# Patient Record
Sex: Female | Born: 1995 | Race: Black or African American | Hispanic: No | Marital: Single | Smoking: Never smoker
Health system: Southern US, Community
[De-identification: ages and names within clinical notes are randomized; demographics above are authoritative.]

## PROBLEM LIST (undated history)

## (undated) DIAGNOSIS — R3121 Asymptomatic microscopic hematuria: Secondary | ICD-10-CM

## (undated) DIAGNOSIS — N390 Urinary tract infection, site not specified: Secondary | ICD-10-CM

## (undated) DIAGNOSIS — F419 Anxiety disorder, unspecified: Secondary | ICD-10-CM

## (undated) DIAGNOSIS — E119 Type 2 diabetes mellitus without complications: Secondary | ICD-10-CM

## (undated) DIAGNOSIS — E282 Polycystic ovarian syndrome: Secondary | ICD-10-CM

---
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
## 2016-03-19 ENCOUNTER — Encounter (HOSPITAL_COMMUNITY): Payer: Self-pay | Admitting: Emergency Medicine

## 2016-03-19 DIAGNOSIS — L309 Dermatitis, unspecified: Secondary | ICD-10-CM | POA: Insufficient documentation

## 2016-03-19 DIAGNOSIS — R21 Rash and other nonspecific skin eruption: Secondary | ICD-10-CM | POA: Diagnosis present

## 2016-03-19 NOTE — ED Notes (Signed)
Pt had tattoo redone three weeks ago, states last night the tattoo area is red and itching. Pt in NAD, resp e/u.

## 2016-03-20 ENCOUNTER — Emergency Department (HOSPITAL_COMMUNITY)
Admission: EM | Admit: 2016-03-20 | Discharge: 2016-03-20 | Disposition: A | Discharge status: Home / Self Care | Payer: Medicaid Other | Attending: Emergency Medicine | Admitting: Emergency Medicine

## 2016-03-20 DIAGNOSIS — L309 Dermatitis, unspecified: Secondary | ICD-10-CM

## 2016-03-20 MED ORDER — DIPHENHYDRAMINE HCL 25 MG PO CAPS
25.0000 mg | ORAL_CAPSULE | Freq: Once | ORAL | Status: AC
  Administered 2016-03-20: 25 mg via ORAL
  Filled 2016-03-20: qty 1

## 2016-03-20 NOTE — Discharge Instructions (Signed)
1. Medications: zyrtec for allergies during the day, benadryl for itching at night, over-the-counter topical anti-itch cream, usual home medications 2. Treatment: rest, drink plenty of fluids 3. Follow Up: please followup with your primary doctor for discussion of your diagnoses and further evaluation after today's visit; if you do not have a primary care doctor use the phone number listed in your discharge paperwork to find one; please return to the ER for new or worsening symptoms

## 2016-03-20 NOTE — ED Provider Notes (Signed)
CSN: [id number]     Arrival date & time 03/19/16  2209 History   First MD Initiated Contact with Patient 03/20/16 [phone]     Chief Complaint  Patient presents with  . Rash     HPI   Julia Michael is a 20 y.o. female with no pertinent PMH who presents to the ED with irritation to the tattoo she had redone several weeks ago. She notes she went to a different tattoo parlor than usual to have pigment added to her tattoo 3 weeks ago, and states she has had some irritation and itching since that time. She denies fever, chills, difficulty swallowing, trouble handling her secretions, shortness of breath, numbness, weakness, paresthesia, pain. She states she has several tattoos though has never experienced these symptoms.   History reviewed. No pertinent past medical history. History reviewed. No pertinent past surgical history. No family history on file. Social History  Substance Use Topics  . Smoking status: Never Smoker   . Smokeless tobacco: None  . Alcohol Use: No   OB History    No data available       Review of Systems  Constitutional: Negative for fever and chills.  HENT: Negative for drooling and trouble swallowing.   Respiratory: Negative for shortness of breath.   Musculoskeletal: Negative for myalgias and arthralgias.  Skin: Positive for rash.  Neurological: Negative for weakness and numbness.      Allergies  Review of patient's allergies indicates no known allergies.  Home Medications   Prior to Admission medications   Medication Sig Start Date End Date Taking? Authorizing Provider  diphenhydrAMINE (BENADRYL) 25 MG tablet Take 2 tablets (50 mg total) by mouth every 4 (four) hours as needed for itching. 08/02/15   Benjamin Cartner, PA-C    BP 130/77 mmHg  Pulse 83  Temp(Src) 98.7 F (37.1 C) (Oral)  Resp 21  SpO2 100%  LMP 02/20/2016 Physical Exam  Constitutional: She is oriented to person, place, and time. She appears well-developed and well-nourished. No  distress.  HENT:  Head: Normocephalic and atraumatic.  Right Ear: External ear normal.  Left Ear: External ear normal.  Nose: Nose normal.  Eyes: Conjunctivae and EOM are normal. Right eye exhibits no discharge. Left eye exhibits no discharge. No scleral icterus.  Neck: Normal range of motion. Neck supple.  Cardiovascular: Normal rate, regular rhythm and intact distal pulses.   Pulmonary/Chest: Effort normal and breath sounds normal. No respiratory distress.  Musculoskeletal: Normal range of motion. She exhibits no edema or tenderness.  Full range of motion of left upper extremity.  Neurological: She is alert and oriented to person, place, and time. She has normal strength. No sensory deficit.  Skin: Skin is warm and dry. She is not diaphoretic.  Tattoo to left shoulder appears clean, dry, and intact. No erythema, edema, or discharge. Patient has a small area of flesh colored papules at the base of the tattoo.   Psychiatric: She has a normal mood and affect. Her behavior is normal.  Nursing note and vitals reviewed.   ED Course  Procedures (including critical care time)  Labs Review Labs Reviewed - No data to display  Imaging Review No results found.    EKG Interpretation None      MDM   Final diagnoses:  Dermatitis    20 year old female presents with irritation at the site of the tattoo she had redone 3 weeks ago. Denies fever, chills, shortness of breath, numbness, weakness, paresthesia. Patient is afebrile. Vital signs stable.  Tattoo to left shoulder appears clean, dry, and intact. No erythema, edema, or discharge to suggest infection. Patient has a small area of flesh colored papules at the base of the tattoo. Symptoms appear consistent with local reaction to pigment. Advised patient to take benadryl for itching and to apply topical anti-itch cream for additional symptom relief. Patient to follow up with PCP. Strict return precautions discussed. Patient verbalizes her  understanding and is in agreement with plan.  BP 130/77 mmHg  Pulse 83  Temp(Src) 98.7 F (37.1 C) (Oral)  Resp 21  SpO2 100%  LMP 02/20/2016      Mady GemmaElizabeth C Westfall, PA-C 03/20/16 0100  Geoffery Lyonsouglas Delo, MD 03/20/16 [phone]

## 2016-03-20 NOTE — ED Notes (Signed)
See PA assessment note. 

## 2016-08-03 ENCOUNTER — Emergency Department (HOSPITAL_COMMUNITY)
Admission: EM | Admit: 2016-08-03 | Discharge: 2016-08-04 | Disposition: A | Discharge status: Home / Self Care | Payer: Medicaid Other | Attending: Emergency Medicine | Admitting: Emergency Medicine

## 2016-08-03 ENCOUNTER — Encounter (HOSPITAL_COMMUNITY): Payer: Self-pay

## 2016-08-03 DIAGNOSIS — Y929 Unspecified place or not applicable: Secondary | ICD-10-CM | POA: Insufficient documentation

## 2016-08-03 DIAGNOSIS — Y999 Unspecified external cause status: Secondary | ICD-10-CM | POA: Insufficient documentation

## 2016-08-03 DIAGNOSIS — Y939 Activity, unspecified: Secondary | ICD-10-CM | POA: Diagnosis not present

## 2016-08-03 DIAGNOSIS — X501XXA Overexertion from prolonged static or awkward postures, initial encounter: Secondary | ICD-10-CM | POA: Insufficient documentation

## 2016-08-03 DIAGNOSIS — S299XXA Unspecified injury of thorax, initial encounter: Secondary | ICD-10-CM | POA: Diagnosis present

## 2016-08-03 DIAGNOSIS — S29012A Strain of muscle and tendon of back wall of thorax, initial encounter: Secondary | ICD-10-CM | POA: Diagnosis not present

## 2016-08-03 DIAGNOSIS — S29019A Strain of muscle and tendon of unspecified wall of thorax, initial encounter: Secondary | ICD-10-CM

## 2016-08-03 LAB — URINALYSIS, ROUTINE W REFLEX MICROSCOPIC
Bilirubin Urine: NEGATIVE
GLUCOSE, UA: NEGATIVE mg/dL
HGB URINE DIPSTICK: NEGATIVE
Ketones, ur: NEGATIVE mg/dL
Leukocytes, UA: NEGATIVE
Nitrite: NEGATIVE
Protein, ur: NEGATIVE mg/dL
SPECIFIC GRAVITY, URINE: 1.023 (ref 1.005–1.030)
pH: 5.5 (ref 5.0–8.0)

## 2016-08-03 LAB — COMPREHENSIVE METABOLIC PANEL
ALT: 26 U/L (ref 14–54)
ANION GAP: 6 (ref 5–15)
AST: 21 U/L (ref 15–41)
Albumin: 3.6 g/dL (ref 3.5–5.0)
Alkaline Phosphatase: 70 U/L (ref 38–126)
BUN: 12 mg/dL (ref 6–20)
CHLORIDE: 105 mmol/L (ref 101–111)
CO2: 26 mmol/L (ref 22–32)
Calcium: 9.8 mg/dL (ref 8.9–10.3)
Creatinine, Ser: 0.86 mg/dL (ref 0.44–1.00)
GFR calc non Af Amer: >60 mL/min (ref >60)
Glucose, Bld: 91 mg/dL (ref 65–99)
Potassium: 3.7 mmol/L (ref 3.5–5.1)
SODIUM: 137 mmol/L (ref 135–145)
Total Bilirubin: 0.4 mg/dL (ref 0.3–1.2)
Total Protein: 8.1 g/dL (ref 6.5–8.1)

## 2016-08-03 LAB — CBC
HEMATOCRIT: 37.4 % (ref 36.0–46.0)
HEMOGLOBIN: 12.5 g/dL (ref 12.0–15.0)
MCH: 29.2 pg (ref 26.0–34.0)
MCHC: 33.4 g/dL (ref 30.0–36.0)
MCV: 87.4 fL (ref 78.0–100.0)
Platelets: 343 10*3/uL (ref 150–400)
RBC: 4.28 MIL/uL (ref 3.87–5.11)
RDW: 13.9 % (ref 11.5–15.5)
WBC: 6.2 10*3/uL (ref 4.0–10.5)

## 2016-08-03 LAB — I-STAT BETA HCG BLOOD, ED (MC, WL, AP ONLY): I-stat hCG, quantitative: <5 m[IU]/mL (ref <5)

## 2016-08-03 LAB — LIPASE, BLOOD: LIPASE: 30 U/L (ref 11–51)

## 2016-08-03 MED ORDER — CYCLOBENZAPRINE HCL 10 MG PO TABS: 10.0000 mg | ORAL_TABLET | Freq: Three times a day (TID) | ORAL | 0 refills | Status: DC | PRN

## 2016-08-03 MED ORDER — TRAMADOL HCL 50 MG PO TABS: 50.0000 mg | ORAL_TABLET | Freq: Four times a day (QID) | ORAL | 0 refills | Status: DC | PRN

## 2016-08-03 NOTE — ED Provider Notes (Signed)
[facility]-EMERGENCY DEPT Provider Note   CSN: [id number] Arrival date & time: 08/03/16  1938  By signing my name below, I, Rosario Adie, attest that this documentation has been prepared under the direction and in the presence of Gilda Crease, MD. Electronically Signed: Rosario Adie, ED Scribe. 08/03/16. 11:42 PM.  History   Chief Complaint Chief Complaint  Patient presents with  . Abdominal Pain   The history is provided by the patient. No language interpreter was used.    HPI Comments: Julia Michael is a 20 y.o. female who presents to the Emergency Department complaining of moderate, intermittent right-sided thoracic back pain onset ~2 days ago. She notes associated constipation since the onset of her pain. Pt reports that twisting and palpation will exacerbate her pain. No recent falls or trauma. Pt notes that she has a hx of renal calculi, and her symptoms today feel mildly similar. Her BMs have been irregular since the onset of her symptoms, and she notes that her stool has been more hardened recently. Denies nausea, emesis, dysuria, hematuria, frequency, urgency, rash, SOB, cough, bowel/bladder incontinence, or any other associated symptoms.   History reviewed. No pertinent past medical history.  There are no active problems to display for this patient.  History reviewed. No pertinent surgical history.  OB History    No data available     Home Medications    Prior to Admission medications   Medication Sig Start Date End Date Taking? Authorizing Provider  cyclobenzaprine (FLEXERIL) 10 MG tablet Take 1 tablet (10 mg total) by mouth 3 (three) times daily as needed for muscle spasms. 08/03/16   Gilda Crease, MD  diphenhydrAMINE (BENADRYL) 25 MG tablet Take 2 tablets (50 mg total) by mouth every 4 (four) hours as needed for itching. 08/02/15   [name], PA-C  traMADol (ULTRAM) 50 MG tablet Take 1 tablet (50 mg total) by mouth every 6 (six)  hours as needed. 08/03/16   Gilda Crease, MD   Family History No family history on file.  Social History Social History  Substance Use Topics  . Smoking status: Never Smoker  . Smokeless tobacco: Never Used  . Alcohol use No   Allergies   Ibuprofen  Review of Systems Review of Systems  Respiratory: Negative for cough and shortness of breath.   Gastrointestinal: Positive for constipation. Negative for nausea and vomiting.  Genitourinary: Negative for difficulty urinating, dysuria, frequency, hematuria and urgency.  Musculoskeletal: Positive for back pain (thoracic).  Skin: Negative for rash.  All other systems reviewed and are negative.  Physical Exam Updated Vital Signs BP 138/61 (BP Location: Right Arm)   Pulse 72   Temp 98.5 F (36.9 C) (Oral)   Resp 16   Ht 5\' 3"  (1.6 m)   Wt 165 lb (74.8 kg)   SpO2 100%   BMI 29.23 kg/m   Physical Exam  Constitutional: She is oriented to person, place, and time. She appears well-developed and well-nourished. No distress.  HENT:  Head: Normocephalic and atraumatic.  Right Ear: Hearing normal.  Left Ear: Hearing normal.  Nose: Nose normal.  Mouth/Throat: Oropharynx is clear and moist and mucous membranes are normal.  Eyes: Conjunctivae and EOM are normal. Pupils are equal, round, and reactive to light.  Neck: Normal range of motion. Neck supple.  Cardiovascular: Regular rhythm, S1 normal and S2 normal.  Exam reveals no gallop and no friction rub.   No murmur heard. Pulmonary/Chest: Effort normal and breath sounds normal. No respiratory  distress. She exhibits no tenderness.  Abdominal: Soft. Normal appearance and bowel sounds are normal. There is no hepatosplenomegaly. There is no tenderness. There is no rebound, no guarding, no tenderness at McBurney's point and negative Murphy's sign. No hernia.  Musculoskeletal: Normal range of motion. She exhibits tenderness.  Right paraspinal thoracic tenderness. Pain is visibly  worsened with twisting on exam.   Neurological: She is alert and oriented to person, place, and time. She has normal strength. No cranial nerve deficit or sensory deficit. Coordination normal. GCS eye subscore is 4. GCS verbal subscore is 5. GCS motor subscore is 6.  Skin: Skin is warm, dry and intact. No rash noted. No cyanosis.  Psychiatric: She has a normal mood and affect. Her speech is normal and behavior is normal. Thought content normal.  Nursing note and vitals reviewed.  ED Treatments / Results  DIAGNOSTIC STUDIES: Oxygen Saturation is 100% on RA, normal by my interpretation.   COORDINATION OF CARE: 11:39 PM-Discussed next steps with pt. Pt verbalized understanding and is agreeable with the plan.   Labs (all labs ordered are listed, but only abnormal results are displayed) Labs Reviewed  LIPASE, BLOOD  COMPREHENSIVE METABOLIC PANEL  CBC  URINALYSIS, ROUTINE W REFLEX MICROSCOPIC (NOT [facility])  I-STAT BETA HCG BLOOD, ED ([facility], [facility], [facility] ONLY)    EKG  EKG Interpretation None       Radiology No results found.  Procedures Procedures (including critical care time)  Medications Ordered in ED Medications - No data to display   Initial Impression / Assessment and Plan / ED Course  I have reviewed the triage vital signs and the nursing notes.  Pertinent labs & imaging results that were available during my care of the patient were reviewed by me and considered in my medical decision making (see chart for details).  Clinical Course    Patient presents to the emergency department for evaluation of pain on the right side of her back. Patient denies any direct injury. She does, however, have a tender area in the right paraspinal region that is also worsened by any movement of the torso. This is consistent with musculoskeletal pain. Patient concerned about the possibility of pneumonia, but she does not have any cough or chest congestion. Lungs are clear, no clinical signs of  pneumonia. Vital signs are normal. PERC/Wells negative. She does not have urinary symptoms. Urinalysis was clear. She was also concerned about the possibility of kidney stone. Workup and examination does not support this. Patient reassured, will be treated symptomatically.  Final Clinical Impressions(s) / ED Diagnoses   Final diagnoses:  Thoracic myofascial strain, initial encounter    New Prescriptions New Prescriptions   CYCLOBENZAPRINE (FLEXERIL) 10 MG TABLET    Take 1 tablet (10 mg total) by mouth 3 (three) times daily as needed for muscle spasms.   TRAMADOL (ULTRAM) 50 MG TABLET    Take 1 tablet (50 mg total) by mouth every 6 (six) hours as needed.  I personally performed the services described in this documentation, which was scribed in my presence. The recorded information has been reviewed and is accurate.     Gilda Creasehristopher J Pollina, MD 08/03/16 [phone]

## 2016-08-03 NOTE — ED Triage Notes (Signed)
Patient complains of right side pain x 2 days. States that the pain is worse with movement. Denies nausea, no urinary symptoms. Concerned that she may have kidney stone, hx of same

## 2016-09-03 ENCOUNTER — Emergency Department (HOSPITAL_COMMUNITY): Payer: Medicaid Other

## 2016-09-03 ENCOUNTER — Encounter (HOSPITAL_COMMUNITY): Payer: Self-pay

## 2016-09-03 ENCOUNTER — Emergency Department (HOSPITAL_COMMUNITY)
Admission: EM | Admit: 2016-09-03 | Discharge: 2016-09-03 | Disposition: A | Discharge status: Home / Self Care | Payer: Medicaid Other | Attending: Emergency Medicine | Admitting: Emergency Medicine

## 2016-09-03 DIAGNOSIS — J069 Acute upper respiratory infection, unspecified: Secondary | ICD-10-CM | POA: Diagnosis not present

## 2016-09-03 DIAGNOSIS — R05 Cough: Secondary | ICD-10-CM | POA: Diagnosis present

## 2016-09-03 LAB — POC URINE PREG, ED: Preg Test, Ur: NEGATIVE

## 2016-09-03 MED ORDER — BENZONATATE 100 MG PO CAPS: 100.0000 mg | ORAL_CAPSULE | Freq: Three times a day (TID) | ORAL | 0 refills | Status: DC | PRN

## 2016-09-03 MED ORDER — ACETAMINOPHEN 325 MG PO TABS
ORAL_TABLET | ORAL | Status: AC
  Filled 2016-09-03: qty 2

## 2016-09-03 MED ORDER — ACETAMINOPHEN 325 MG PO TABS
650.0000 mg | ORAL_TABLET | Freq: Once | ORAL | Status: AC
  Administered 2016-09-03: 650 mg via ORAL

## 2016-09-03 MED ORDER — ACETAMINOPHEN 500 MG PO TABS: 1000.0000 mg | ORAL_TABLET | Freq: Four times a day (QID) | ORAL | 0 refills | Status: DC | PRN

## 2016-09-03 NOTE — ED Notes (Signed)
Pt is in stable condition upon d/c and ambulates from ED. 

## 2016-09-03 NOTE — ED Provider Notes (Signed)
[facility]-EMERGENCY DEPT Provider Note   CSN: [id number] Arrival date & time: 09/03/16  1009  By signing my name below, I, Emmanuella Mensah, attest that this documentation has been prepared under the direction and in the presence of Cheri Fowler, PA-C. Electronically Signed: Angelene Giovanni, ED Scribe. 09/03/16. 11:08 AM.   History   Chief Complaint No chief complaint on file.   HPI Comments: Brownie Gockel is a 20 y.o. female who presents to the Emergency Department complaining of gradually worsening moderate generalized body aches onset 2 days ago, worsening last night. She reports associated productive cough with clear sputum, nasal congestion, nausea, chills, and fever (highest 101 PTA). No alleviating factors noted. Pt has not tried any medications PTA. She denies any known sick contacts. Pt has an allergy to Ibuprofen. She denies any neck stiffness, sore throat, abdominal pain, vomiting, diarrhea, shortness of breath, CP, generalized rash, or any other symptoms.   The history is provided by the patient. No language interpreter was used.    History reviewed. No pertinent past medical history.  There are no active problems to display for this patient.   History reviewed. No pertinent surgical history.  OB History    No data available       Home Medications    Prior to Admission medications   Medication Sig Start Date End Date Taking? Authorizing Provider  acetaminophen (TYLENOL) 500 MG tablet Take 2 tablets (1,000 mg total) by mouth every 6 (six) hours as needed. 09/03/16   Georgie Haque, PA-C  benzonatate (TESSALON) 100 MG capsule Take 1 capsule (100 mg total) by mouth 3 (three) times daily as needed for cough. 09/03/16   Cheri Fowler, PA-C  cyclobenzaprine (FLEXERIL) 10 MG tablet Take 1 tablet (10 mg total) by mouth 3 (three) times daily as needed for muscle spasms. 08/03/16   Gilda Crease, MD  diphenhydrAMINE (BENADRYL) 25 MG tablet Take 2 tablets (50 mg total) by  mouth every 4 (four) hours as needed for itching. 08/02/15   [name], PA-C  traMADol (ULTRAM) 50 MG tablet Take 1 tablet (50 mg total) by mouth every 6 (six) hours as needed. 08/03/16   Gilda Crease, MD    Family History No family history on file.  Social History Social History  Substance Use Topics  . Smoking status: Never Smoker  . Smokeless tobacco: Never Used  . Alcohol use No     Allergies   Ibuprofen   Review of Systems Review of Systems  Constitutional: Positive for chills and fever.  HENT: Positive for congestion.   Respiratory: Positive for cough. Negative for shortness of breath.   Gastrointestinal: Positive for nausea. Negative for diarrhea and vomiting.  Musculoskeletal: Positive for myalgias.  All other systems reviewed and are negative.    Physical Exam Updated Vital Signs BP 128/97 (BP Location: Right Arm)   Pulse 97   Temp 101 F (38.3 C) (Oral)   Resp 18   SpO2 100%   Physical Exam  Constitutional: She is oriented to person, place, and time. She appears well-developed and well-nourished. She is active.  Non-toxic appearance. She does not have a sickly appearance. She does not appear ill.  HENT:  Head: Normocephalic and atraumatic.  Right Ear: Tympanic membrane and external ear normal. Tympanic membrane is not erythematous and not bulging.  Left Ear: Tympanic membrane and external ear normal. Tympanic membrane is not erythematous and not bulging.  Nose: Nose normal.  Mouth/Throat: Uvula is midline, oropharynx is clear and moist and  mucous membranes are normal. No trismus in the jaw. No uvula swelling. No oropharyngeal exudate, posterior oropharyngeal edema, posterior oropharyngeal erythema or tonsillar abscesses.  Eyes: Conjunctivae are normal. No scleral icterus.  Neck: Normal range of motion. Neck supple. No tracheal deviation present.  No nuchal rigidity.   Cardiovascular: Normal rate and regular rhythm.   Pulmonary/Chest: Effort  normal and breath sounds normal. No respiratory distress. She has no wheezes. She has no rales.  Abdominal: Soft. Bowel sounds are normal. She exhibits no distension. There is no tenderness.  Musculoskeletal: Normal range of motion.  Lymphadenopathy:    She has no cervical adenopathy.  Neurological: She is alert and oriented to person, place, and time.  Skin: Skin is warm and dry.  Psychiatric: She has a normal mood and affect. Her behavior is normal.     ED Treatments / Results  DIAGNOSTIC STUDIES: Oxygen Saturation is 100% on RA, normal by my interpretation.    COORDINATION OF CARE: 11:04 AM- Pt advised of plan for treatment and pt agrees. Pt will receive chest x-ray for further evaluation.    Labs (all labs ordered are listed, but only abnormal results are displayed) Labs Reviewed  POC URINE PREG, ED    EKG  EKG Interpretation None       Radiology Dg Chest 2 View  Result Date: 09/03/2016 CLINICAL DATA:  Cough and congestion for 3 days EXAM: CHEST  2 VIEW COMPARISON:  None FINDINGS: The heart size and mediastinal contours are within normal limits. Both lungs are clear. The visualized skeletal structures are unremarkable. IMPRESSION: No active cardiopulmonary disease. Electronically Signed   By: Signa Kellaylor  Stroud M.D.   On: 09/03/2016 11:32    Procedures Procedures (including critical care time)  Medications Ordered in ED Medications  acetaminophen (TYLENOL) tablet 650 mg (650 mg Oral Given 09/03/16 1016)     Initial Impression / Assessment and Plan / ED Course  Cheri FowlerKayla Coila Wardell, PA-C has reviewed the triage vital signs and the nursing notes.  Pertinent labs & imaging results that were available during my care of the patient were reviewed by me and considered in my medical decision making (see chart for details).  Clinical Course   Patient presents with flu like illness vs viral URI.  No indication for treatment with tamiflu. Appears non-septic or ill.  Heart sounds  normal, lungs clear, abdomen soft and benign. On arrival, temp 101, she's normotensive and HR 97, normal RR.  Received Tylenol and fever reduced, 98.8.  CXR negative. Allergic to Ibuprofen.  Home with Tylenol and tessalon perles.  Follow up PCP.  Return precautions discussed.  Stable for discharge.    Final Clinical Impressions(s) / ED Diagnoses   Final diagnoses:  Upper respiratory tract infection, unspecified type    New Prescriptions New Prescriptions   ACETAMINOPHEN (TYLENOL) 500 MG TABLET    Take 2 tablets (1,000 mg total) by mouth every 6 (six) hours as needed.   BENZONATATE (TESSALON) 100 MG CAPSULE    Take 1 capsule (100 mg total) by mouth 3 (three) times daily as needed for cough.   I personally performed the services described in this documentation, which was scribed in my presence. The recorded information has been reviewed and is accurate.    Cheri FowlerKayla Ethan Clayburn, PA-C 09/03/16 1144    Gwyneth SproutWhitney Plunkett, MD 09/04/16 2215

## 2016-09-03 NOTE — ED Notes (Signed)
Patient transported to X-ray 

## 2016-09-03 NOTE — ED Triage Notes (Signed)
Patient complains of generalized body aches, fever, cough and congestion with chills x 3 days, NAD

## 2017-08-18 ENCOUNTER — Emergency Department (HOSPITAL_COMMUNITY): Payer: BLUE CROSS/BLUE SHIELD

## 2017-08-18 ENCOUNTER — Encounter (HOSPITAL_COMMUNITY): Payer: Self-pay

## 2017-08-18 DIAGNOSIS — R319 Hematuria, unspecified: Secondary | ICD-10-CM | POA: Insufficient documentation

## 2017-08-18 DIAGNOSIS — R0602 Shortness of breath: Secondary | ICD-10-CM | POA: Diagnosis not present

## 2017-08-18 DIAGNOSIS — R0789 Other chest pain: Secondary | ICD-10-CM | POA: Diagnosis not present

## 2017-08-18 DIAGNOSIS — Z79899 Other long term (current) drug therapy: Secondary | ICD-10-CM | POA: Diagnosis not present

## 2017-08-18 DIAGNOSIS — R079 Chest pain, unspecified: Secondary | ICD-10-CM | POA: Diagnosis present

## 2017-08-18 LAB — I-STAT TROPONIN, ED: Troponin i, poc: 0.01 ng/mL (ref 0.00–0.08)

## 2017-08-18 LAB — CBC
HCT: 35.8 % — ABNORMAL LOW (ref 36.0–46.0)
Hemoglobin: 11.9 g/dL — ABNORMAL LOW (ref 12.0–15.0)
MCH: 28.7 pg (ref 26.0–34.0)
MCHC: 33.2 g/dL (ref 30.0–36.0)
MCV: 86.3 fL (ref 78.0–100.0)
PLATELETS: 340 10*3/uL (ref 150–400)
RBC: 4.15 MIL/uL (ref 3.87–5.11)
RDW: 13.5 % (ref 11.5–15.5)
WBC: 6.1 10*3/uL (ref 4.0–10.5)

## 2017-08-18 LAB — BASIC METABOLIC PANEL
ANION GAP: 3 — AB (ref 5–15)
BUN: 11 mg/dL (ref 6–20)
CALCIUM: 9.1 mg/dL (ref 8.9–10.3)
CO2: 23 mmol/L (ref 22–32)
CREATININE: 0.87 mg/dL (ref 0.44–1.00)
Chloride: 106 mmol/L (ref 101–111)
Glucose, Bld: 108 mg/dL — ABNORMAL HIGH (ref 65–99)
Potassium: 3.7 mmol/L (ref 3.5–5.1)
SODIUM: 132 mmol/L — AB (ref 135–145)

## 2017-08-18 LAB — I-STAT BETA HCG BLOOD, ED (MC, WL, AP ONLY): I-stat hCG, quantitative: <5 m[IU]/mL (ref <5)

## 2017-08-18 LAB — CBG MONITORING, ED: GLUCOSE-CAPILLARY: 110 mg/dL — AB (ref 65–99)

## 2017-08-18 NOTE — ED Triage Notes (Signed)
Onset 3-4 days intermittant "chest fluttering and chest tightness".  Onset 30 minutes PTA chest tightness, hands clammy, and shortness of breath.  No respiratory difficulties at this time, talking in complete sentences.  Pt had recent sinus infection with antibiotic therapy.  Pt lost grandfather after diagnosed and did not complete course of antibiotics.  Pts sinus infection symptoms has improved, still having itchy, watery eyes, nasal congestion, nasal pressure.  No fevers.

## 2017-08-19 ENCOUNTER — Emergency Department (HOSPITAL_COMMUNITY)
Admission: EM | Admit: 2017-08-19 | Discharge: 2017-08-19 | Disposition: A | Discharge status: Home / Self Care | Payer: BLUE CROSS/BLUE SHIELD | Attending: Emergency Medicine | Admitting: Emergency Medicine

## 2017-08-19 DIAGNOSIS — R0789 Other chest pain: Secondary | ICD-10-CM

## 2017-08-19 DIAGNOSIS — R319 Hematuria, unspecified: Secondary | ICD-10-CM

## 2017-08-19 HISTORY — DX: Anxiety disorder, unspecified: F41.9

## 2017-08-19 LAB — URINALYSIS, ROUTINE W REFLEX MICROSCOPIC
BACTERIA UA: NONE SEEN
Bilirubin Urine: NEGATIVE
GLUCOSE, UA: NEGATIVE mg/dL
KETONES UR: NEGATIVE mg/dL
Leukocytes, UA: NEGATIVE
Nitrite: NEGATIVE
PROTEIN: NEGATIVE mg/dL
Specific Gravity, Urine: 1.016 (ref 1.005–1.030)
pH: 5 (ref 5.0–8.0)

## 2017-08-19 NOTE — ED Notes (Signed)
Pt. Refused to sign.

## 2017-08-19 NOTE — ED Notes (Signed)
Pt ambulatory to restroom with steady gait noted  

## 2017-08-19 NOTE — Discharge Instructions (Signed)
Your labs tests looked good today.  There was a little blood in your urine, would monitor this closely. Follow-up with your primary care doctor if you continue having ongoing issues. Return here for any new/worsening symptoms.

## 2017-08-19 NOTE — ED Provider Notes (Signed)
[facility] Provider Note   CSN: [id number] Arrival date & time: 08/18/17  2140     History   Chief Complaint Chief Complaint  Patient presents with  . Chest Pain    HPI Julia Michael is a 21 y.o. female.  The history is provided by the patient and medical records.  Chest Pain     21 y.o. F with hx of anxiety, presenting to the ED for chest pain.  Patient states symptoms began approx 30 mins PTA.  Patient states she had an episode of chest tightness, shortness of breath, and sweating palms.  States this lasted for about 15-20 minutes before resolving. Patient does report history of anxiety, but reports the symptoms feel different. On arrival to the ED, patient reports she is feeling better. She denies any shortness of breath are present. Does report recent sinus infection, was on amoxicillin for this. States she did not complete the course as her grandfather unfortunately passed away and it "slipped her mind". She does report she has been upset and grieving his loss recently. She denies any known cardiac history. No significant family cardiac history. She is not on any type of birth control. No recent travel, prolonged immobilization. No history of DVT or PE.  During assessment patient also mentioned that her urine today appear dark and she had some mild dysuria. She denies any abdominal pain or pelvic pain. No vaginal complaints. She does report she has not had a menstrual cycle in a few months.  Past Medical History:  Diagnosis Date  . Anxiety     There are no active problems to display for this patient.   History reviewed. No pertinent surgical history.  OB History    No data available       Home Medications    Prior to Admission medications   Medication Sig Start Date End Date Taking? Authorizing Provider  acetaminophen (TYLENOL) 500 MG tablet Take 2 tablets (1,000 mg total) by mouth every 6 (six) hours as needed. 09/03/16   Cheri Fowler, PA-C  benzonatate  (TESSALON) 100 MG capsule Take 1 capsule (100 mg total) by mouth 3 (three) times daily as needed for cough. 09/03/16   Cheri Fowler, PA-C  cyclobenzaprine (FLEXERIL) 10 MG tablet Take 1 tablet (10 mg total) by mouth 3 (three) times daily as needed for muscle spasms. 08/03/16   Gilda Crease, MD  diphenhydrAMINE (BENADRYL) 25 MG tablet Take 2 tablets (50 mg total) by mouth every 4 (four) hours as needed for itching. 08/02/15   Cartner, Sharlet Salina, PA-C  traMADol (ULTRAM) 50 MG tablet Take 1 tablet (50 mg total) by mouth every 6 (six) hours as needed. 08/03/16   Gilda Crease, MD    Family History History reviewed. No pertinent family history.  Social History Social History  Substance Use Topics  . Smoking status: Never Smoker  . Smokeless tobacco: Never Used  . Alcohol use No     Allergies   Ibuprofen   Review of Systems Review of Systems  Cardiovascular: Positive for chest pain.  Genitourinary: Positive for dysuria.  All other systems reviewed and are negative.    Physical Exam Updated Vital Signs BP 112/69   Pulse (!) 58   Temp 98.2 F (36.8 C) (Oral)   Resp 19   LMP 05/18/2017 Comment: could be pregnant / double shielded patient  SpO2 100%   Physical Exam  Constitutional: She is oriented to person, place, and time. She appears well-developed and well-nourished.  HENT:  Head: Normocephalic and atraumatic.  Right Ear: Tympanic membrane and ear canal normal.  Left Ear: Tympanic membrane and ear canal normal.  Nose: Nose normal.  Mouth/Throat: Uvula is midline, oropharynx is clear and moist and mucous membranes are normal.  Eyes: Pupils are equal, round, and reactive to light. Conjunctivae and EOM are normal.  Neck: Normal range of motion.  Cardiovascular: Normal rate, regular rhythm and normal heart sounds.   Pulmonary/Chest: Effort normal and breath sounds normal. No respiratory distress. She has no wheezes.  Chest wall non-tender, lungs clear, no  distress  Abdominal: Soft. Bowel sounds are normal.  Musculoskeletal: Normal range of motion.  Neurological: She is alert and oriented to person, place, and time.  Skin: Skin is warm and dry.  Psychiatric: She has a normal mood and affect.  Nursing note and vitals reviewed.    ED Treatments / Results  Labs (all labs ordered are listed, but only abnormal results are displayed) Labs Reviewed  BASIC METABOLIC PANEL - Abnormal; Notable for the following:       Result Value   Sodium 132 (*)    Glucose, Bld 108 (*)    Anion gap 3 (*)    All other components within normal limits  CBC - Abnormal; Notable for the following:    Hemoglobin 11.9 (*)    HCT 35.8 (*)    All other components within normal limits  CBG MONITORING, ED - Abnormal; Notable for the following:    Glucose-Capillary 110 (*)    All other components within normal limits  I-STAT TROPONIN, ED  I-STAT BETA HCG BLOOD, ED ([facility], [facility], [facility] ONLY)    EKG  EKG Interpretation  Date/Time:  Tuesday August 18 2017 21:43:41 EDT Ventricular Rate:  85 PR Interval:  152 QRS Duration: 78 QT Interval:  354 QTC Calculation: 421 R Axis:   58 Text Interpretation:  Normal sinus rhythm with sinus arrhythmia Possible Acute pericarditis Abnormal ECG No significant change since last tracing Confirmed by Derwood Kaplan [phone]) on 08/19/2017 2:50:06 AM       Radiology Dg Chest 2 View  Result Date: 08/18/2017 CLINICAL DATA:  LEFT chest pain for several days, LEFT arm numbness. EXAM: CHEST  2 VIEW COMPARISON:  Chest radiograph September 02, 2016 FINDINGS: Cardiomediastinal silhouette is normal. No pleural effusions or focal consolidations. Trachea projects midline and there is no pneumothorax. Soft tissue planes and included osseous structures are non-suspicious. IMPRESSION: Stable normal chest. Electronically Signed   By: Awilda Metro M.D.   On: 08/18/2017 22:20    Procedures Procedures (including critical care time)  Medications  Ordered in ED Medications - No data to display   Initial Impression / Assessment and Plan / ED Course  I have reviewed the triage vital signs and the nursing notes.  Pertinent labs & imaging results that were available during my care of the patient were reviewed by me and considered in my medical decision making (see chart for details).  21 year old female here with chest pain. Reports had an episode 30 minutes prior to arrival of chest tightness, shortness of breath, and sweaty palms. Does have history of anxiety. Recent loss of her grandfather. EKG is nonischemic. Tracing mentioned possible pericarditis, however clinically do not suspect this. Labs overall reassuring. No acute findings on exam. No chest wall tenderness. Does not appear to be a pleuritic or exertional component.  No tachycardia or hypoxia ot suggest PE.  PERC negative.  Clinically doubt ACS.  I feel that her anxiety may be  playing a factor in her symptoms.  Patient was also complaining of some dysuria and dark urine, UA here with some blood noted but no signs of infection. Do not clinically suspect rhabdo or acute stone.  Will have her monitor this closely at home. Can follow-up with PCP.  Discussed plan with patient, she acknowledged understanding and agreed with plan of care.  Return precautions given for new or worsening symptoms.  Final Clinical Impressions(s) / ED Diagnoses   Final diagnoses:  Chest tightness  Hematuria, unspecified type    New Prescriptions New Prescriptions   No medications on file     Garlon Hatchet, PA-C 08/19/17 1610    Derwood Kaplan, MD 08/19/17 [phone]

## 2017-09-21 ENCOUNTER — Other Ambulatory Visit: Payer: Self-pay | Admitting: Family

## 2017-09-21 DIAGNOSIS — R1030 Lower abdominal pain, unspecified: Secondary | ICD-10-CM

## 2017-09-23 ENCOUNTER — Other Ambulatory Visit: Payer: Self-pay

## 2017-10-15 ENCOUNTER — Emergency Department (HOSPITAL_COMMUNITY)
Admission: EM | Admit: 2017-10-15 | Discharge: 2017-10-15 | Disposition: A | Discharge status: Home / Self Care | Payer: BLUE CROSS/BLUE SHIELD | Attending: Emergency Medicine | Admitting: Emergency Medicine

## 2017-10-15 ENCOUNTER — Other Ambulatory Visit: Payer: Self-pay

## 2017-10-15 ENCOUNTER — Encounter (HOSPITAL_COMMUNITY): Payer: Self-pay | Admitting: *Deleted

## 2017-10-15 DIAGNOSIS — E876 Hypokalemia: Secondary | ICD-10-CM | POA: Insufficient documentation

## 2017-10-15 DIAGNOSIS — E119 Type 2 diabetes mellitus without complications: Secondary | ICD-10-CM | POA: Diagnosis not present

## 2017-10-15 DIAGNOSIS — R109 Unspecified abdominal pain: Secondary | ICD-10-CM | POA: Diagnosis present

## 2017-10-15 HISTORY — DX: Type 2 diabetes mellitus without complications: E11.9

## 2017-10-15 LAB — COMPREHENSIVE METABOLIC PANEL
ALBUMIN: 3.7 g/dL (ref 3.5–5.0)
ALK PHOS: 68 U/L (ref 38–126)
ALT: 14 U/L (ref 14–54)
ANION GAP: 7 (ref 5–15)
AST: 18 U/L (ref 15–41)
BILIRUBIN TOTAL: 0.3 mg/dL (ref 0.3–1.2)
BUN: 7 mg/dL (ref 6–20)
CALCIUM: 9 mg/dL (ref 8.9–10.3)
CO2: 25 mmol/L (ref 22–32)
CREATININE: 0.79 mg/dL (ref 0.44–1.00)
Chloride: 106 mmol/L (ref 101–111)
GFR calc Af Amer: >60 mL/min (ref >60)
GFR calc non Af Amer: >60 mL/min (ref >60)
GLUCOSE: 124 mg/dL — AB (ref 65–99)
Potassium: 3 mmol/L — ABNORMAL LOW (ref 3.5–5.1)
SODIUM: 138 mmol/L (ref 135–145)
TOTAL PROTEIN: 8.1 g/dL (ref 6.5–8.1)

## 2017-10-15 LAB — CBC
HCT: 38.3 % (ref 36.0–46.0)
HEMOGLOBIN: 12.6 g/dL (ref 12.0–15.0)
MCH: 28.8 pg (ref 26.0–34.0)
MCHC: 32.9 g/dL (ref 30.0–36.0)
MCV: 87.6 fL (ref 78.0–100.0)
PLATELETS: 360 10*3/uL (ref 150–400)
RBC: 4.37 MIL/uL (ref 3.87–5.11)
RDW: 14.2 % (ref 11.5–15.5)
WBC: 6 10*3/uL (ref 4.0–10.5)

## 2017-10-15 LAB — URINALYSIS, ROUTINE W REFLEX MICROSCOPIC
Bacteria, UA: NONE SEEN
Bilirubin Urine: NEGATIVE
GLUCOSE, UA: NEGATIVE mg/dL
KETONES UR: NEGATIVE mg/dL
Leukocytes, UA: NEGATIVE
Nitrite: NEGATIVE
PH: 6 (ref 5.0–8.0)
Protein, ur: NEGATIVE mg/dL
SPECIFIC GRAVITY, URINE: 1.02 (ref 1.005–1.030)

## 2017-10-15 LAB — LIPASE, BLOOD: Lipase: 28 U/L (ref 11–51)

## 2017-10-15 LAB — I-STAT BETA HCG BLOOD, ED (MC, WL, AP ONLY): I-stat hCG, quantitative: <5 m[IU]/mL (ref <5)

## 2017-10-15 MED ORDER — POTASSIUM CHLORIDE CRYS ER 20 MEQ PO TBCR
40.0000 meq | EXTENDED_RELEASE_TABLET | Freq: Once | ORAL | Status: AC
  Administered 2017-10-15: 40 meq via ORAL
  Filled 2017-10-15: qty 2

## 2017-10-15 MED ORDER — POTASSIUM CHLORIDE ER 10 MEQ PO TBCR: 10.0000 meq | EXTENDED_RELEASE_TABLET | Freq: Two times a day (BID) | ORAL | 0 refills | Status: DC

## 2017-10-15 NOTE — ED Notes (Signed)
Hx of anxiety 

## 2017-10-15 NOTE — ED Notes (Signed)
Pt states she understandsinstructions. Home stable with steady ngait with friend.

## 2017-10-15 NOTE — ED Provider Notes (Signed)
[facility] Provider [facility]   CSN: [id number] Arrival date & time: 10/15/17  0219     History   Chief Complaint Chief Complaint  Patient presents with  . Chest Pain  . Abdominal Pain    HPI Julia Michael is a 21 y.o. female.  HPI   Julia Michael is a 21 y.o. female, with a history of anxiety and DM, presenting to the ED with abdominal cramping that occurred last night.  When her cramping started, she states, "I kind of freaked out and my heart started to race."  Also endorses some loose stools and nausea over the past week. Was recently diagnosed with DM two weeks ago by her OB/GYN, placed on metformin.  States she has been compliant.  States, "I do not think that I have been eating the way that I should." Denies illicit drug and alcohol use.  Currently menstruating.  Denies vomiting, fever/chills, current abdominal pain, chest pain, shortness of breath, dizziness, urinary complaints, abnormal vaginal discharge, or any other complaints.    Past Medical History:  Diagnosis Date  . Anxiety   . Diabetes mellitus without complication (HCC)     There are no active problems to display for this patient.   History reviewed. No pertinent surgical history.  OB History    No data available       Home Medications    Prior to Admission medications   Medication Sig Start Date End Date Taking? Authorizing Provider  acetaminophen (TYLENOL) 500 MG tablet Take 2 tablets (1,000 mg total) by mouth every 6 (six) hours as needed. 09/03/16   Cheri Fowlerose, Kayla, PA-C  benzonatate (TESSALON) 100 MG capsule Take 1 capsule (100 mg total) by mouth 3 (three) times daily as needed for cough. 09/03/16   Cheri Fowlerose, Kayla, PA-C  cyclobenzaprine (FLEXERIL) 10 MG tablet Take 1 tablet (10 mg total) by mouth 3 (three) times daily as needed for muscle spasms. 08/03/16   Gilda CreasePollina, Christopher J, MD  diphenhydrAMINE (BENADRYL) 25 MG tablet Take 2 tablets (50 mg total) by  mouth every 4 (four) hours as needed for itching. 08/02/15   Cartner, Sharlet SalinaBenjamin, PA-C  potassium chloride (K-DUR) 10 MEQ tablet Take 1 tablet (10 mEq total) by mouth 2 (two) times daily for 5 days. 10/15/17 10/20/17  Joy, Shawn C, PA-C  traMADol (ULTRAM) 50 MG tablet Take 1 tablet (50 mg total) by mouth every 6 (six) hours as needed. 08/03/16   Gilda CreasePollina, Christopher J, MD    Family History No family history on file.  Social History Social History   Tobacco Use  . Smoking status: Never Smoker  . Smokeless tobacco: Never Used  Substance Use Topics  . Alcohol use: No  . Drug use: No     Allergies   Ibuprofen   Review of Systems Review of Systems  Constitutional: Negative for chills, diaphoresis and fever.  Respiratory: Negative for shortness of breath.   Cardiovascular: Negative for chest pain.  Gastrointestinal: Positive for abdominal pain, diarrhea and nausea. Negative for blood in stool and vomiting.  Genitourinary: Negative for dysuria, frequency and vaginal discharge.  Musculoskeletal: Negative for back pain.  Neurological: Negative for dizziness, weakness, light-headedness, numbness and headaches.  All other systems reviewed and are negative.    Physical Exam Updated Vital Signs BP 118/79 (BP Location: Right Arm)   Pulse 64   Temp 98.2 F (36.8 C) (Oral)   Resp 17   Ht 5\' 3"  (1.6 m)   Wt 81.6 kg (180  lb)   LMP 10/15/2017   SpO2 99%   BMI 31.89 kg/m   Physical Exam  Constitutional: She appears well-developed and well-nourished. No distress.  Patient is lounging in the exam chair playing on her phone.  HENT:  Head: Normocephalic and atraumatic.  Mouth/Throat: Oropharynx is clear and moist.  Eyes: Conjunctivae and EOM are normal. Pupils are equal, round, and reactive to light.  Neck: Neck supple.  Cardiovascular: Normal rate, regular rhythm, normal heart sounds and intact distal pulses.  Pulmonary/Chest: Effort normal and breath sounds normal. No respiratory  distress.  Patient shows no increased work of breathing.  Speaks in full sentences without difficulty.  Abdominal: Soft. There is no tenderness. There is no guarding.  Musculoskeletal: She exhibits no edema or tenderness.  No tenderness in the patient's extremities.  Lymphadenopathy:    She has no cervical adenopathy.  Neurological: She is alert.  Skin: Skin is warm and dry. Capillary refill takes less than 2 seconds. She is not diaphoretic.  Psychiatric: She has a normal mood and affect. Her behavior is normal.  Nursing note and vitals reviewed.    ED Treatments / Results  Labs (all labs ordered are listed, but only abnormal results are displayed) Labs Reviewed  COMPREHENSIVE METABOLIC PANEL - Abnormal; Notable for the following components:      Result Value   Potassium 3.0 (*)    Glucose, Bld 124 (*)    All other components within normal limits  URINALYSIS, ROUTINE W REFLEX MICROSCOPIC - Abnormal; Notable for the following components:   Hgb urine dipstick MODERATE (*)    Squamous Epithelial / LPF 0-5 (*)    All other components within normal limits  LIPASE, BLOOD  CBC  I-STAT BETA HCG BLOOD, ED ([facility], [facility], [facility] ONLY)    EKG  EKG Interpretation  Date/Time:  Thursday October 15 2017 02:25:14 EST Ventricular Rate:  96 PR Interval:  158 QRS Duration: 72 QT Interval:  336 QTC Calculation: 424 R Axis:   62 Text Interpretation:  Normal sinus rhythm Normal ECG Normal ECG Confirmed by Gerhard Munch [phone]) on 10/15/2017 9:40:27 AM       Radiology No results found.  Procedures Procedures (including critical care time)  Medications Ordered in ED Medications  potassium chloride SA (K-DUR,KLOR-CON) CR tablet 40 mEq (40 mEq Oral Given 10/15/17 1006)     Initial Impression / Assessment and Plan / ED Course  I have reviewed the triage vital signs and the nursing notes.  Pertinent labs & imaging results that were available during my care of the patient were reviewed by me  and considered in my medical decision making (see chart for details).     Patient presents with an episode of abdominal cramping.  Suspect patient's other symptoms may be possibly due to the metformin. Patient is nontoxic appearing, afebrile, not tachycardic on my exam, not tachypneic, not hypotensive, maintains SPO2 of 99-100% on room air, and is in no apparent distress.  Hypokalemia noted and addressed.  Recommend PCP follow-up.  Resources given. The patient was given instructions for home care as well as return precautions. Patient voices understanding of these instructions, accepts the plan, and is comfortable with discharge.   Vitals:   10/15/17 0945 10/15/17 0957 10/15/17 1000 10/15/17 1018  BP: 111/70 111/70 109/71 120/78  Pulse: 71 60 65 76  Resp: 19 18 18 17   Temp:    97.9 F (36.6 C)  TempSrc:    Oral  SpO2: 100% 99% 100% 100%  Weight:  Height:         Final Clinical Impressions(s) / ED Diagnoses   Final diagnoses:  Abdominal cramping  Hypokalemia    ED Discharge Orders        Ordered    potassium chloride (K-DUR) 10 MEQ tablet  2 times daily     10/15/17 0958       Anselm PancoastJoy, Shawn C, PA-C 10/15/17 1135    Gerhard MunchLockwood, Robert, MD 10/15/17 1551

## 2017-10-15 NOTE — ED Triage Notes (Signed)
The pt is c/o some chest discomfort with abd pain weakness in her legs   For 30 minutes before she arrived here  She is also c/o a headache  lmp now

## 2017-10-15 NOTE — ED Notes (Signed)
Update given  

## 2017-10-15 NOTE — Discharge Instructions (Signed)
Your lab results today were reassuring.  There was some evidence of low potassium.  Take the prescribed supplement, as directed.  Have your potassium retested either with a primary care provider, at an urgent care, or in the ED in a week. Some of your symptoms may be due to the metformin.  These are minor side effects and adequate hydration and proper food intake should help.  Continue to take the metformin as directed.  Please follow-up with primary care provider on these matters.  Call the number provided as soon as possible to set up an appointment.

## 2018-02-09 ENCOUNTER — Ambulatory Visit: Payer: PRIVATE HEALTH INSURANCE | Admitting: Neurology

## 2018-02-09 ENCOUNTER — Telehealth: Payer: Self-pay | Admitting: *Deleted

## 2018-02-09 NOTE — Telephone Encounter (Signed)
Pt no showed new pt appt on 02/09/2018

## 2018-02-15 ENCOUNTER — Encounter (HOSPITAL_COMMUNITY): Payer: Self-pay | Admitting: *Deleted

## 2018-02-15 ENCOUNTER — Emergency Department (HOSPITAL_COMMUNITY)
Admission: EM | Admit: 2018-02-15 | Discharge: 2018-02-15 | Disposition: A | Discharge status: Home / Self Care | Payer: No Typology Code available for payment source | Attending: Emergency Medicine | Admitting: Emergency Medicine

## 2018-02-15 ENCOUNTER — Emergency Department (HOSPITAL_COMMUNITY): Payer: No Typology Code available for payment source

## 2018-02-15 ENCOUNTER — Other Ambulatory Visit: Payer: Self-pay

## 2018-02-15 DIAGNOSIS — K219 Gastro-esophageal reflux disease without esophagitis: Secondary | ICD-10-CM | POA: Diagnosis not present

## 2018-02-15 DIAGNOSIS — R0789 Other chest pain: Secondary | ICD-10-CM

## 2018-02-15 DIAGNOSIS — E119 Type 2 diabetes mellitus without complications: Secondary | ICD-10-CM | POA: Insufficient documentation

## 2018-02-15 LAB — CBC
HCT: 37.7 % (ref 36.0–46.0)
HEMOGLOBIN: 12 g/dL (ref 12.0–15.0)
MCH: 28.4 pg (ref 26.0–34.0)
MCHC: 31.8 g/dL (ref 30.0–36.0)
MCV: 89.1 fL (ref 78.0–100.0)
Platelets: 349 10*3/uL (ref 150–400)
RBC: 4.23 MIL/uL (ref 3.87–5.11)
RDW: 13.6 % (ref 11.5–15.5)
WBC: 5.3 10*3/uL (ref 4.0–10.5)

## 2018-02-15 LAB — BASIC METABOLIC PANEL
ANION GAP: 8 (ref 5–15)
BUN: 10 mg/dL (ref 6–20)
CO2: 26 mmol/L (ref 22–32)
Calcium: 9.2 mg/dL (ref 8.9–10.3)
Chloride: 103 mmol/L (ref 101–111)
Creatinine, Ser: 0.85 mg/dL (ref 0.44–1.00)
GFR calc Af Amer: >60 mL/min (ref >60)
GLUCOSE: 97 mg/dL (ref 65–99)
POTASSIUM: 3.8 mmol/L (ref 3.5–5.1)
Sodium: 137 mmol/L (ref 135–145)

## 2018-02-15 LAB — D-DIMER, QUANTITATIVE: D-Dimer, Quant: 0.32 ug/mL-FEU (ref 0.00–0.50)

## 2018-02-15 MED ORDER — SODIUM CHLORIDE 0.9 % IV BOLUS
1000.0000 mL | Freq: Once | INTRAVENOUS | Status: AC
  Administered 2018-02-15: 1000 mL via INTRAVENOUS

## 2018-02-15 MED ORDER — ACETAMINOPHEN 500 MG PO TABS
1000.0000 mg | ORAL_TABLET | Freq: Once | ORAL | Status: AC
  Administered 2018-02-15: 1000 mg via ORAL
  Filled 2018-02-15: qty 2

## 2018-02-15 MED ORDER — FAMOTIDINE 20 MG PO TABS
20.0000 mg | ORAL_TABLET | Freq: Once | ORAL | Status: AC
  Administered 2018-02-15: 20 mg via ORAL
  Filled 2018-02-15: qty 1

## 2018-02-15 MED ORDER — GI COCKTAIL ~~LOC~~
30.0000 mL | Freq: Once | ORAL | Status: AC
  Administered 2018-02-15: 30 mL via ORAL
  Filled 2018-02-15: qty 30

## 2018-02-15 MED ORDER — FAMOTIDINE 20 MG PO TABS: 20.0000 mg | ORAL_TABLET | Freq: Two times a day (BID) | ORAL | 0 refills | Status: DC

## 2018-02-15 NOTE — ED Provider Notes (Signed)
[facility] EMERGENCY DEPARTMENT Provider Note   CSN: [id number] Arrival date & time: 02/15/18  0522     History   Chief Complaint Chief Complaint  Patient presents with  . Chest Pain    HPI Julia Michael is a 22 y.o. female.  Julia Michael is a 22 y.o. Female history of diabetes and anxiety, who presents to the ED for evaluation of 3-4 days of intermittent chest heaviness.  Patient reports for the past 4 days she has had intermittent episodes of chest heaviness and tightness, she describes it as feeling like there is a vice around her chest, she reports wearing a bra or anything constricting makes this more uncomfortable.  She denies any redness of the skin or rash, no breast pain.  She does report some mild shortness of breath, but reports chest pain is not pleuritic or exertional and is nonradiating.  Pain got worse tonight when she was trying to lay down.  She is tried some Alka-Seltzer because she has been belching a lot with this pain with mild improvement has not tried anything else to treat her symptoms, denies any other aggravating or alleviating factors.  Patient denies fevers or chills, cough or other URI symptoms.  She denies abdominal pain, nausea, vomiting or diarrhea.  No urinary symptoms.  Patient is not on any estrogen therapy, but does report she recently drove to and from [place], no recent surgeries, no history of blood clot or DVT, denies leg swelling or pain.     Past Medical History:  Diagnosis Date  . Anxiety   . Diabetes mellitus without complication (HCC)     There are no active problems to display for this patient.   History reviewed. No pertinent surgical history.   OB History   None      Home Medications    Prior to Admission medications   Medication Sig Start Date End Date Taking? Authorizing Provider  acetaminophen (TYLENOL) 500 MG tablet Take 2 tablets (1,000 mg total) by mouth every 6 (six) hours as needed. 09/03/16    Cheri Fowler, PA-C  benzonatate (TESSALON) 100 MG capsule Take 1 capsule (100 mg total) by mouth 3 (three) times daily as needed for cough. 09/03/16   Cheri Fowler, PA-C  cyclobenzaprine (FLEXERIL) 10 MG tablet Take 1 tablet (10 mg total) by mouth 3 (three) times daily as needed for muscle spasms. 08/03/16   Gilda Crease, MD  diphenhydrAMINE (BENADRYL) 25 MG tablet Take 2 tablets (50 mg total) by mouth every 4 (four) hours as needed for itching. 08/02/15   Cartner, Sharlet Salina, PA-C  potassium chloride (K-DUR) 10 MEQ tablet Take 1 tablet (10 mEq total) by mouth 2 (two) times daily for 5 days. 10/15/17 10/20/17  Joy, Shawn C, PA-C  traMADol (ULTRAM) 50 MG tablet Take 1 tablet (50 mg total) by mouth every 6 (six) hours as needed. 08/03/16   Gilda Crease, MD    Family History No family history on file.  Social History Social History   Tobacco Use  . Smoking status: Never Smoker  . Smokeless tobacco: Never Used  Substance Use Topics  . Alcohol use: No  . Drug use: No     Allergies   Ibuprofen   Review of Systems Review of Systems  Constitutional: Negative for chills and fever.  HENT: Negative for congestion, rhinorrhea and sore throat.   Eyes: Negative for discharge, redness and itching.  Respiratory: Positive for chest tightness and shortness of breath. Negative for cough  and wheezing.   Cardiovascular: Positive for chest pain. Negative for palpitations and leg swelling.  Gastrointestinal: Negative for abdominal pain, diarrhea, nausea and vomiting.  Genitourinary: Negative for dysuria and frequency.  Musculoskeletal: Negative for arthralgias and myalgias.  Skin: Negative for color change and rash.  Neurological: Negative for dizziness, syncope and headaches.     Physical Exam Updated Vital Signs BP 132/80 (BP Location: Right Arm)   Pulse 75   Temp 98.4 F (36.9 C) (Oral)   Resp 18   LMP 02/15/2018   SpO2 98%   Physical Exam  Constitutional: She is oriented  to person, place, and time. She appears well-developed and well-nourished.  HENT:  Head: Normocephalic and atraumatic.  Mouth/Throat: Oropharynx is clear and moist.  Eyes: EOM are normal.  Neck: Normal range of motion. Neck supple.  Cardiovascular: Normal rate, regular rhythm, normal heart sounds and intact distal pulses.  Pulses:      Radial pulses are 2+ on the right side, and 2+ on the left side.       Dorsalis pedis pulses are 2+ on the right side, and 2+ on the left side.  Pulmonary/Chest: Effort normal and breath sounds normal. No accessory muscle usage or stridor. No tachypnea. No respiratory distress. She has no decreased breath sounds. She has no wheezes. She has no rhonchi. She has no rales.  Respirations equal and unlabored, patient able to speak in full sentences, lungs clear to auscultation bilaterally  Abdominal: Soft. Bowel sounds are normal. She exhibits no distension and no mass. There is no tenderness. There is no guarding.  Abdomen soft, nondistended, nontender to palpation in all quadrants without guarding or peritoneal signs  Musculoskeletal: She exhibits no edema or deformity.  Lateral lower extremities without edema or tenderness, negative Homans  Neurological: She is alert and oriented to person, place, and time. Coordination normal.  Skin: Skin is warm and dry. Capillary refill takes less than 2 seconds.  Psychiatric: She has a normal mood and affect. Her behavior is normal.  Nursing note and vitals reviewed.    ED Treatments / Results  Labs (all labs ordered are listed, but only abnormal results are displayed) Labs Reviewed  CBC  BASIC METABOLIC PANEL  D-DIMER, QUANTITATIVE (NOT AT [facility])    EKG EKG Interpretation  Date/Time:  Monday February 15 2018 05:31:42 EDT Ventricular Rate:  66 PR Interval:  170 QRS Duration: 78 QT Interval:  382 QTC Calculation: 400 R Axis:   77 Text Interpretation:  Normal sinus rhythm Early repolarization pattern unremarkable  ecg Confirmed by Gerhard Munch [phone]) on 02/15/2018 8:08:31 AM   Radiology Dg Chest 2 View  Result Date: 02/15/2018 CLINICAL DATA:  Chest pain for 4 days radiating to back. EXAM: CHEST - 2 VIEW COMPARISON:  Chest radiograph August 18, 2017 FINDINGS: Cardiomediastinal silhouette is normal. No pleural effusions or focal consolidations. Trachea projects midline and there is no pneumothorax. Soft tissue planes and included osseous structures are non-suspicious. IMPRESSION: Chest negative. Electronically Signed   By: Awilda Metro M.D.   On: 02/15/2018 06:13    Procedures Procedures (including critical care time)  Medications Ordered in ED Medications  gi cocktail (Maalox,Lidocaine,Donnatal) (30 mLs Oral Given 02/15/18 0849)  sodium chloride 0.9 % bolus 1,000 mL (0 mLs Intravenous Stopped 02/15/18 1035)  famotidine (PEPCID) tablet 20 mg (20 mg Oral Given 02/15/18 0850)  acetaminophen (TYLENOL) tablet 1,000 mg (1,000 mg Oral Given 02/15/18 0911)     Initial Impression / Assessment and Plan / ED Course  I have reviewed the triage vital signs and the nursing notes.  Pertinent labs & imaging results that were available during my care of the patient were reviewed by me and considered in my medical decision making (see chart for details).  Presents to the ED for evaluation of 3-4 days of intermittent chest heaviness.  Feels like her chest is in a vice grip, is not pleuritic or exertional in nature, pain is nonradiating.  Associated mild shortness of breath no abdominal pain, does report frequent belching, mild symptom relief with Alka-Seltzer.  On initial evaluation normal vitals and patient is well-appearing.  Given patient's age and low risk I have extremely low suspicion for ACS especially given atypical story, EKG without any concerning ischemic changes.  Chest x-ray shows no active cardiopulmonary disease.  Patient has not on any estrogen therapy, no clinical evidence of DVT, but patient did recently  drive to [place], no recent surgeries, we will get basic labs and d-dimer to rule out PE.  Will give IV fluids, GI cocktail and Pepcid I have a high suspicion that patient's symptoms are related to GERD especially given belching and mild relief with Alka-Seltzer.  Labs extremely reassuring, d-dimer negative, no leukocytosis, normal hemoglobin, no electrolyte derangements and normal kidney function.  Discussed these results with the patient on reevaluation she reports she is feeling much better, currently chest pain-free.  At this time I feel is stable for discharge home we will start patient on twice daily Pepcid, provided education on GERD and diet modification, encourage patient follow-up with primary care doctor.  Return precautions discussed.  Patient expressed understanding and is in agreement with plan.  Vitals:   02/15/18 0816 02/15/18 0818 02/15/18 0830 02/15/18 0930  BP: (!) 103/56  102/64 116/80  Pulse:  64 62 65  Resp:      Temp:      TempSrc:      SpO2:  97% 100% 100%     Final Clinical Impressions(s) / ED Diagnoses   Final diagnoses:  Atypical chest pain  Gastroesophageal reflux disease, esophagitis presence not specified    ED Discharge Orders        Ordered    famotidine (PEPCID) 20 MG tablet  2 times daily     02/15/18 1040       Jodi GeraldsFord, Jhair Witherington [place]N, [place]PA-C 02/15/18 1053    Gerhard MunchLockwood, Robert, MD 02/15/18 1707

## 2018-02-15 NOTE — Discharge Instructions (Signed)
Your evaluation today is very reassuring, EKG, labs and x-ray show no evidence of acute problem with your heart or lungs causing her symptoms today.  I think you likely have acid reflux please begin taking Pepcid twice daily at least 30-60 minutes before breakfast and dinner.  Please use the phone number provided in your paperwork to establish care with a primary doctor for continued evaluation.  Return to ED for worsening chest pain, shortness of breath, fevers or chills or other new or concerning symptoms.

## 2018-02-15 NOTE — ED Triage Notes (Signed)
THE PT IS C/O CHEST HEAVINESS  FOR A FEW DAYS  WORSE TONIGHT.  SHE FEELS LIKE THERE IS A VICE AROUND HER CHEST  SHE CANNOT WEAR ANYTHING CONSTRICTING AROUND HER CHEST  LMP NOW

## 2018-02-16 ENCOUNTER — Encounter: Payer: Self-pay | Admitting: Neurology

## 2018-03-23 ENCOUNTER — Emergency Department (HOSPITAL_COMMUNITY): Payer: PRIVATE HEALTH INSURANCE

## 2018-03-23 ENCOUNTER — Other Ambulatory Visit: Payer: Self-pay

## 2018-03-23 ENCOUNTER — Emergency Department (HOSPITAL_COMMUNITY)
Admission: EM | Admit: 2018-03-23 | Discharge: 2018-03-24 | Disposition: A | Discharge status: Home / Self Care | Payer: PRIVATE HEALTH INSURANCE | Attending: Emergency Medicine | Admitting: Emergency Medicine

## 2018-03-23 ENCOUNTER — Encounter (HOSPITAL_COMMUNITY): Payer: Self-pay | Admitting: Emergency Medicine

## 2018-03-23 DIAGNOSIS — R0789 Other chest pain: Secondary | ICD-10-CM | POA: Diagnosis not present

## 2018-03-23 DIAGNOSIS — R0602 Shortness of breath: Secondary | ICD-10-CM | POA: Diagnosis not present

## 2018-03-23 DIAGNOSIS — R079 Chest pain, unspecified: Secondary | ICD-10-CM | POA: Diagnosis present

## 2018-03-23 DIAGNOSIS — Z7984 Long term (current) use of oral hypoglycemic drugs: Secondary | ICD-10-CM | POA: Insufficient documentation

## 2018-03-23 DIAGNOSIS — K219 Gastro-esophageal reflux disease without esophagitis: Secondary | ICD-10-CM

## 2018-03-23 DIAGNOSIS — E119 Type 2 diabetes mellitus without complications: Secondary | ICD-10-CM | POA: Insufficient documentation

## 2018-03-23 HISTORY — DX: Polycystic ovarian syndrome: E28.2

## 2018-03-23 LAB — BASIC METABOLIC PANEL
Anion gap: 8 (ref 5–15)
BUN: 7 mg/dL (ref 6–20)
CALCIUM: 9.1 mg/dL (ref 8.9–10.3)
CO2: 22 mmol/L (ref 22–32)
CREATININE: 0.88 mg/dL (ref 0.44–1.00)
Chloride: 106 mmol/L (ref 101–111)
GFR calc non Af Amer: >60 mL/min (ref >60)
Glucose, Bld: 100 mg/dL — ABNORMAL HIGH (ref 65–99)
Potassium: 3.6 mmol/L (ref 3.5–5.1)
SODIUM: 136 mmol/L (ref 135–145)

## 2018-03-23 LAB — CBC
HEMATOCRIT: 37.1 % (ref 36.0–46.0)
HEMOGLOBIN: 12.1 g/dL (ref 12.0–15.0)
MCH: 28.2 pg (ref 26.0–34.0)
MCHC: 32.6 g/dL (ref 30.0–36.0)
MCV: 86.5 fL (ref 78.0–100.0)
PLATELETS: 335 10*3/uL (ref 150–400)
RBC: 4.29 MIL/uL (ref 3.87–5.11)
RDW: 13.4 % (ref 11.5–15.5)
WBC: 5.5 10*3/uL (ref 4.0–10.5)

## 2018-03-23 MED ORDER — ACETAMINOPHEN 500 MG PO TABS
1000.0000 mg | ORAL_TABLET | Freq: Once | ORAL | Status: AC
  Administered 2018-03-23: 1000 mg via ORAL
  Filled 2018-03-23: qty 2

## 2018-03-23 MED ORDER — ESOMEPRAZOLE MAGNESIUM 20 MG PO CPDR: 20.0000 mg | DELAYED_RELEASE_CAPSULE | Freq: Every day | ORAL | 0 refills | Status: DC

## 2018-03-23 MED ORDER — GI COCKTAIL ~~LOC~~
30.0000 mL | Freq: Once | ORAL | Status: AC
  Administered 2018-03-23: 30 mL via ORAL
  Filled 2018-03-23: qty 30

## 2018-03-23 NOTE — Discharge Instructions (Signed)
Your evaluation today is very reassuring, EKG, labs and chest x-ray do not suggest an acute problem with your heart or lungs.  I do think your acid reflux could be playing a role as well as musculoskeletal pain.  Please begin taking Nexium once daily in the morning before meals, you may use Tylenol as needed for pain.  Please use the phone number provided in your paperwork today to follow-up with a primary care provider.  Return to the emergency department for persistent or worsening chest pain especially if it radiates to the arm neck or jaw or is worse with exertion, or you have pain with deep breaths, swelling in one leg, feel like you are going to pass out or any other new or concerning symptoms.

## 2018-03-23 NOTE — ED Provider Notes (Signed)
Patient placed in Quick Look pathway, seen and evaluated   Chief Complaint: Chest pain or shortness of breath  HPI: Patient with history of anxiety presents to the emergency department with upper chest pain worsened with movement with also subjective shortness of breath.  Patient has not had any difficulties with any activities.  She denies fever or cough.  No nausea, vomiting, or diarrhea.  No wheezing or URI symptoms recently.  No treatments prior to arrival.  Patient with recent 12 hr car ride to [place] with one break, but denies any new leg pain or swelling. Patient denies risk factors for pulmonary embolism including: unilateral leg swelling, history of DVT/PE/other blood clots, use of exogenous hormones, recent immobilizations, recent surgery, malignancy, hemoptysis.   ROS:  Positive ROS: (+) Chest pain, shortness of breath Negative ROS: (-) fever, cough, abdominal pain, leg swelling  Physical Exam:   Gen: No distress, anxious appearing  Neuro: Awake and Alert  Skin: Warm    Focused Exam: Heart RRR, nml S1,S2, no m/r/g; Lungs CTAB; Abd soft, NT, no rebound or guarding; Ext 2+ pedal pulses bilaterally, no edema, no clinical signs suggestive of DVT.  BP 117/68 (BP Location: Left Arm)   Pulse 67   Temp 98.4 F (36.9 C) (Oral)   Resp 16   SpO2 100%   Plan: Labs, EKG, CXR. Low concern for ACS. Low concern for PE even with recent travel. She is PERC negative.   Initiation of care has begun. The patient has been counseled on the process, plan, and necessity for staying for the completion/evaluation, and the remainder of the medical screening examination    Renne Crigler, Cordelia Poche 03/23/18 1718    Eber Hong, MD 03/24/18 [phone]

## 2018-03-23 NOTE — ED Provider Notes (Signed)
[facility] EMERGENCY DEPARTMENT Provider Note   CSN: [id number] Arrival date & time: 03/23/18  1702     History   Chief Complaint Chief Complaint  Patient presents with  . Chest Pain  . Shortness of Breath  . Dizziness    HPI Julia Michael is a 22 y.o. female.  Julia Michael is a 22 y.o. Female with a history of diabetes, PCOS and anxiety, who presents to the ED for evaluation of upper chest pain with some subjective shortness of breath.  Patient reports for the past few days that she has had several brief episodes of chest pain, she reports they last maybe 5 minutes at most and go away without intervention, chest pain does not radiate anywhere and is not exertional.  She has not had any difficulty completing activities.  She reports some occasional subjective shortness of breath.  She denies any fevers or cough, reports some occasional epigastric discomfort, does have history of reflux is not taking any medication for currently and reports constant indigestion and feeling the need to belch.  She denies any nausea or vomiting.  No diaphoresis.  Patient denies any diarrhea, melena or hematochezia.  Patient is currently symptom-free.  She reports she did have a 12-hour car ride to [place] with some breaks, but denies any unilateral leg pain or swelling, no recent surgeries, no exogenous hormones, no cancer, hemoptysis or history of DVT or PE.       Past Medical History:  Diagnosis Date  . Anxiety   . Diabetes mellitus without complication (HCC)   . PCOS (polycystic ovarian syndrome)     There are no active problems to display for this patient.   History reviewed. No pertinent surgical history.   OB History   None      Home Medications    Prior to Admission medications   Medication Sig Start Date End Date Taking? Authorizing Provider  acetaminophen (TYLENOL) 500 MG tablet Take 2 tablets (1,000 mg total) by mouth every 6 (six) hours as  needed. Patient taking differently: Take 1,000 mg by mouth every 6 (six) hours as needed (for headaches).  09/03/16  Yes Cheri Fowler, PA-C  cetirizine (ZYRTEC) 10 MG tablet Take 10 mg by mouth daily as needed for allergies.   Yes [provider]  diphenhydrAMINE (BENADRYL) 25 MG tablet Take 2 tablets (50 mg total) by mouth every 4 (four) hours as needed for itching. Patient taking differently: Take 25-50 mg by mouth every 4 (four) hours as needed (for allerrgies).  08/02/15  Yes Cartner, Sharlet Salina, PA-C  metFORMIN (GLUCOPHAGE-XR) 500 MG 24 hr tablet Take 500 mg by mouth daily.  01/05/18  Yes [provider]  benzonatate (TESSALON) 100 MG capsule Take 1 capsule (100 mg total) by mouth 3 (three) times daily as needed for cough. Patient not taking: Reported on 03/23/2018 09/03/16   Cheri Fowler, PA-C  cyclobenzaprine (FLEXERIL) 10 MG tablet Take 1 tablet (10 mg total) by mouth 3 (three) times daily as needed for muscle spasms. Patient not taking: Reported on 03/23/2018 08/03/16   Gilda Crease, MD  famotidine (PEPCID) 20 MG tablet Take 1 tablet (20 mg total) by mouth 2 (two) times daily. Patient not taking: Reported on 03/23/2018 02/15/18   Dartha Lodge, PA-C  potassium chloride (K-DUR) 10 MEQ tablet Take 1 tablet (10 mEq total) by mouth 2 (two) times daily for 5 days. Patient not taking: Reported on 03/23/2018 10/15/17 03/23/18  Anselm Pancoast, PA-C  traMADol Janean Sark)  50 MG tablet Take 1 tablet (50 mg total) by mouth every 6 (six) hours as needed. Patient not taking: Reported on 03/23/2018 08/03/16   Gilda Crease, MD    Family History History reviewed. No pertinent family history.  Social History Social History   Tobacco Use  . Smoking status: Never Smoker  . Smokeless tobacco: Never Used  Substance Use Topics  . Alcohol use: No  . Drug use: No     Allergies   Ibuprofen and Pecan nut (diagnostic)   Review of Systems Review of Systems  Constitutional: Negative for  chills and fever.  HENT: Negative for congestion, rhinorrhea and sore throat.   Eyes: Negative for visual disturbance.  Respiratory: Positive for shortness of breath. Negative for cough, chest tightness and wheezing.   Cardiovascular: Positive for chest pain. Negative for palpitations and leg swelling.  Gastrointestinal: Positive for abdominal pain. Negative for blood in stool, diarrhea, nausea and vomiting.  Genitourinary: Negative for dysuria and frequency.  Musculoskeletal: Negative for arthralgias, back pain, myalgias and neck pain.  Skin: Negative for color change and rash.  Neurological: Negative for dizziness, syncope and light-headedness.     Physical Exam Updated Vital Signs BP 118/73 (BP Location: Left Arm)   Pulse 68   Temp 98.6 F (37 C) (Oral)   Resp 16   Ht  (1.6 m)   Wt 81.6 kg (180 lb)   LMP 01/21/2018 Comment: hx of PCOS  SpO2 100%   BMI 31.89 kg/m   Physical Exam  Constitutional: She appears well-developed and well-nourished. No distress.  Patient on her phone and talking with friends in the room on my evaluation, she appears to be in no acute distress  HENT:  Head: Normocephalic and atraumatic.  Mouth/Throat: Oropharynx is clear and moist.  Eyes: Right eye exhibits no discharge. Left eye exhibits no discharge.  Neck: Normal range of motion. Neck supple. No JVD present. No tracheal deviation present.  Cardiovascular: Normal rate, regular rhythm, normal heart sounds and intact distal pulses.  Pulmonary/Chest: Effort normal and breath sounds normal. No stridor. No respiratory distress. She has no wheezes. She has no rales.  Respirations equal and unlabored, patient able to speak in full sentences, lungs clear to auscultation bilaterally, there is some tenderness over the costochondral junctions with palpation  Abdominal: Soft. Bowel sounds are normal. She exhibits no distension and no mass. There is no tenderness. There is no guarding.  Musculoskeletal: She  exhibits no edema or deformity.  Bilateral lower extremities without edema or tenderness, 2+ DP and TP pulses bilaterally  Neurological: She is alert. Coordination normal.  Skin: Skin is warm and dry. Capillary refill takes less than 2 seconds. She is not diaphoretic.  Psychiatric: She has a normal mood and affect. Her behavior is normal.  Nursing note and vitals reviewed.    ED Treatments / Results  Labs (all labs ordered are listed, but only abnormal results are displayed) Labs Reviewed  BASIC METABOLIC PANEL - Abnormal; Notable for the following components:      Result Value   Glucose, Bld 100 (*)    All other components within normal limits  CBC    EKG None  Radiology Dg Chest 2 View  Result Date: 03/23/2018 CLINICAL DATA:  22 year old female with intermittent chest pain, lightheadedness and shortness of breath beginning a few days ago. Palpitations. EXAM: CHEST - 2 VIEW COMPARISON:  02/15/2018 and earlier. FINDINGS: Lung volumes are stable and within normal limits. The heart size and mediastinal  contours are within normal limits. Visualized tracheal air column is within normal limits. Both lungs are clear. No pneumothorax or pleural effusion. The visualized skeletal structures are unremarkable. Paucity of bowel gas aside from the gastric fundus. IMPRESSION: Negative.  No acute cardiopulmonary abnormality. Electronically Signed   By: Odessa Fleming M.D.   On: 03/23/2018 18:36    Procedures Procedures (including critical care time)  Medications Ordered in ED Medications - No data to display   Initial Impression / Assessment and Plan / ED Course  I have reviewed the triage vital signs and the nursing notes.  Pertinent labs & imaging results that were available during my care of the patient were reviewed by me and considered in my medical decision making (see chart for details).  Patient presents to the ED for evaluation of several intermittent episodes of chest pain or shortness of  breath, which are relieved without intervention.  She does report chest pain is associated with belching and indigestion, has history of reflux but is not currently on any medications.  Very minimal epigastric tenderness without guarding abdomen otherwise benign.  Chest does have some tenderness over the costochondral junctions.  Lungs are clear.  I have low suspicion for ACS given that patient is young and otherwise healthy, and pain seems very atypical.  Patient has not been tachycardic, hypoxic or tachypneic low concern for PE, and she is PERC negative.  Story not consistent with dissection.  Lab work initiated from quick look pathway and very reassuring.  No leukocytosis, normal hemoglobin, no electrolyte derangements, chest x-ray shows no active cardiopulmonary disease.  EKG is reassuring.  Treat with GI cocktail and Tylenol suspect GERD versus musculoskeletal component.  Will have patient follow-up with primary care.  Recommend Tylenol as needed for pain and Nexium daily.  Strict return precautions discussed.  Final Clinical Impressions(s) / ED Diagnoses   Final diagnoses:  Atypical chest pain  Shortness of breath  Gastroesophageal reflux disease, esophagitis presence not specified    ED Discharge Orders    None       Legrand Rams 03/23/18 2339    Linwood Dibbles, MD 03/24/18 0010

## 2018-03-23 NOTE — ED Notes (Addendum)
This RN has had multiple conversations with the patient and her mother regarding care so far and future plan of care. Pt states she is "agitated" with wait times.

## 2018-03-23 NOTE — ED Triage Notes (Signed)
Pt presents with intermittent CP with lightheadedness and sob that started "a few days ago, I cant remember"; pt also states she cannot remember what she was doing when the CP began but states there is nothing that makes them better or worse; pt also reporting palpitations

## 2019-04-26 ENCOUNTER — Emergency Department (HOSPITAL_COMMUNITY)
Admission: EM | Admit: 2019-04-26 | Discharge: 2019-04-26 | Disposition: A | Discharge status: Home / Self Care | Payer: No Typology Code available for payment source | Attending: Emergency Medicine | Admitting: Emergency Medicine

## 2019-04-26 ENCOUNTER — Other Ambulatory Visit: Payer: Self-pay

## 2019-04-26 DIAGNOSIS — R222 Localized swelling, mass and lump, trunk: Secondary | ICD-10-CM | POA: Diagnosis not present

## 2019-04-26 DIAGNOSIS — E119 Type 2 diabetes mellitus without complications: Secondary | ICD-10-CM | POA: Insufficient documentation

## 2019-04-26 DIAGNOSIS — Z79899 Other long term (current) drug therapy: Secondary | ICD-10-CM | POA: Diagnosis not present

## 2019-04-26 DIAGNOSIS — M533 Sacrococcygeal disorders, not elsewhere classified: Secondary | ICD-10-CM | POA: Diagnosis not present

## 2019-04-26 DIAGNOSIS — Z7984 Long term (current) use of oral hypoglycemic drugs: Secondary | ICD-10-CM | POA: Diagnosis not present

## 2019-04-26 NOTE — ED Provider Notes (Signed)
[facility] Provider Note   CSN: [id number] Arrival date & time: 04/26/19  1849    History   Chief Complaint Chief Complaint  Patient presents with  . lump    HPI Julia Michael is a 23 y.o. female.     HPI   23 year old female presents today with complaints of pain over her sacrum.  Patient notes she has a nodule on her sacrum that has been there for several years.  She notes this is tender.  She denies any redness fever or changes in discomfort.  She has not been evaluated for this previously.  Past Medical History:  Diagnosis Date  . Anxiety   . Diabetes mellitus without complication ([place])   . PCOS (polycystic ovarian syndrome)     There are no active problems to display for this patient.   No past surgical history on file.   OB History   No obstetric history on file.      Home Medications    Prior to Admission medications   Medication Sig Start Date End Date Taking? Authorizing Provider  acetaminophen (TYLENOL) 500 MG tablet Take 2 tablets (1,000 mg total) by mouth every 6 (six) hours as needed. Patient taking differently: Take 1,000 mg by mouth every 6 (six) hours as needed (for headaches).  09/03/16   Gloriann Loan, PA-C  benzonatate (TESSALON) 100 MG capsule Take 1 capsule (100 mg total) by mouth 3 (three) times daily as needed for cough. Patient not taking: Reported on 03/23/2018 09/03/16   Gloriann Loan, PA-C  cetirizine (ZYRTEC) 10 MG tablet Take 10 mg by mouth daily as needed for allergies.    [provider]  cyclobenzaprine (FLEXERIL) 10 MG tablet Take 1 tablet (10 mg total) by mouth 3 (three) times daily as needed for muscle spasms. Patient not taking: Reported on 03/23/2018 08/03/16   Orpah Greek, MD  diphenhydrAMINE (BENADRYL) 25 MG tablet Take 2 tablets (50 mg total) by mouth every 4 (four) hours as needed for itching. Patient taking differently: Take 25-50 mg by mouth every 4 (four) hours as  needed (for allerrgies).  08/02/15   Cartner, Marland Kitchen, PA-C  esomeprazole (NEXIUM) 20 MG capsule Take 1 capsule (20 mg total) by mouth daily. 03/23/18   Jacqlyn Larsen, PA-C  famotidine (PEPCID) 20 MG tablet Take 1 tablet (20 mg total) by mouth 2 (two) times daily. Patient not taking: Reported on 03/23/2018 02/15/18   Jacqlyn Larsen, PA-C  metFORMIN (GLUCOPHAGE-XR) 500 MG 24 hr tablet Take 500 mg by mouth daily.  01/05/18   [provider]  potassium chloride (K-DUR) 10 MEQ tablet Take 1 tablet (10 mEq total) by mouth 2 (two) times daily for 5 days. Patient not taking: Reported on 03/23/2018 10/15/17 03/23/18  Joy, Helane Gunther, PA-C  traMADol (ULTRAM) 50 MG tablet Take 1 tablet (50 mg total) by mouth every 6 (six) hours as needed. Patient not taking: Reported on 03/23/2018 08/03/16   Orpah Greek, MD    Family History No family history on file.  Social History Social History   Tobacco Use  . Smoking status: Never Smoker  . Smokeless tobacco: Never Used  Substance Use Topics  . Alcohol use: No  . Drug use: No     Allergies   Ibuprofen and Pecan nut (diagnostic)   Review of Systems Review of Systems  All other systems reviewed and are negative.    Physical Exam Updated Vital Signs BP 133/75   Pulse 75  Temp 98.8 F (37.1 C) (Oral)   Resp 18   SpO2 98%   Physical Exam Vitals signs and nursing note reviewed.  Constitutional:      Appearance: She is well-developed.  HENT:     Head: Normocephalic and atraumatic.  Eyes:     General: No scleral icterus.       Right eye: No discharge.        Left eye: No discharge.     Conjunctiva/sclera: Conjunctivae normal.     Pupils: Pupils are equal, round, and reactive to light.  Neck:     Musculoskeletal: Normal range of motion.     Vascular: No JVD.     Trachea: No tracheal deviation.  Pulmonary:     Effort: Pulmonary effort is normal.     Breath sounds: No stridor.  Musculoskeletal:     Comments: Back is atraumatic,  no significant tenderness palpation of the lower back sacrum or gluteus-no obvious masses noted, no findings noted on ultrasound-chaperone present for exam  Neurological:     Mental Status: She is alert and oriented to person, place, and time.     Coordination: Coordination normal.  Psychiatric:        Behavior: Behavior normal.        Thought Content: Thought content normal.        Judgment: Judgment normal.      ED Treatments / Results  Labs (all labs ordered are listed, but only abnormal results are displayed) Labs Reviewed - No data to display  EKG None  Radiology No results found.  Procedures Procedures (including critical care time)  Medications Ordered in ED Medications - No data to display   Initial Impression / Assessment and Plan / ED Course  I have reviewed the triage vital signs and the nursing notes.  Pertinent labs & imaging results that were available during my care of the patient were reviewed by me and considered in my medical decision making (see chart for details).        23 year old female presents today with pain over her coccyx for several years.  No acute changes, no signs of infection.  Encouraged follow-up with primary care for ongoing evaluation management.  Return precautions given.  Final Clinical Impressions(s) / ED Diagnoses   Final diagnoses:  Sacral pain    ED Discharge Orders    None       Rosalio LoudHedges, Neyra Pettie, PA-C 04/26/19 1910    Linwood DibblesKnapp, Jon, MD 04/28/19 [phone]

## 2019-04-26 NOTE — Discharge Instructions (Addendum)
Please read attached information. If you experience any new or worsening signs or symptoms please return to the emergency room for evaluation. Please follow-up with your primary care provider or specialist as discussed.  °

## 2019-04-26 NOTE — ED Triage Notes (Signed)
Pt  C/o "painful lump " to the sacrum x 2 months ; pt denies any fevers

## 2019-05-31 ENCOUNTER — Other Ambulatory Visit: Payer: Self-pay | Admitting: *Deleted

## 2019-05-31 DIAGNOSIS — Z20822 Contact with and (suspected) exposure to covid-19: Secondary | ICD-10-CM

## 2019-06-01 NOTE — Addendum Note (Signed)
Addended by: Brigitte Pulse on: 06/01/2019 09:03 PM   Modules accepted: Orders

## 2019-06-26 IMAGING — DX DG CHEST 2V
2 series · 2 of 2 positions shown · non-contrast
Comparison: Chest radiograph August 18, 2017

CLINICAL DATA: Chest pain for 4 days radiating to back.

EXAM:
CHEST - 2 VIEW

[chest pa]
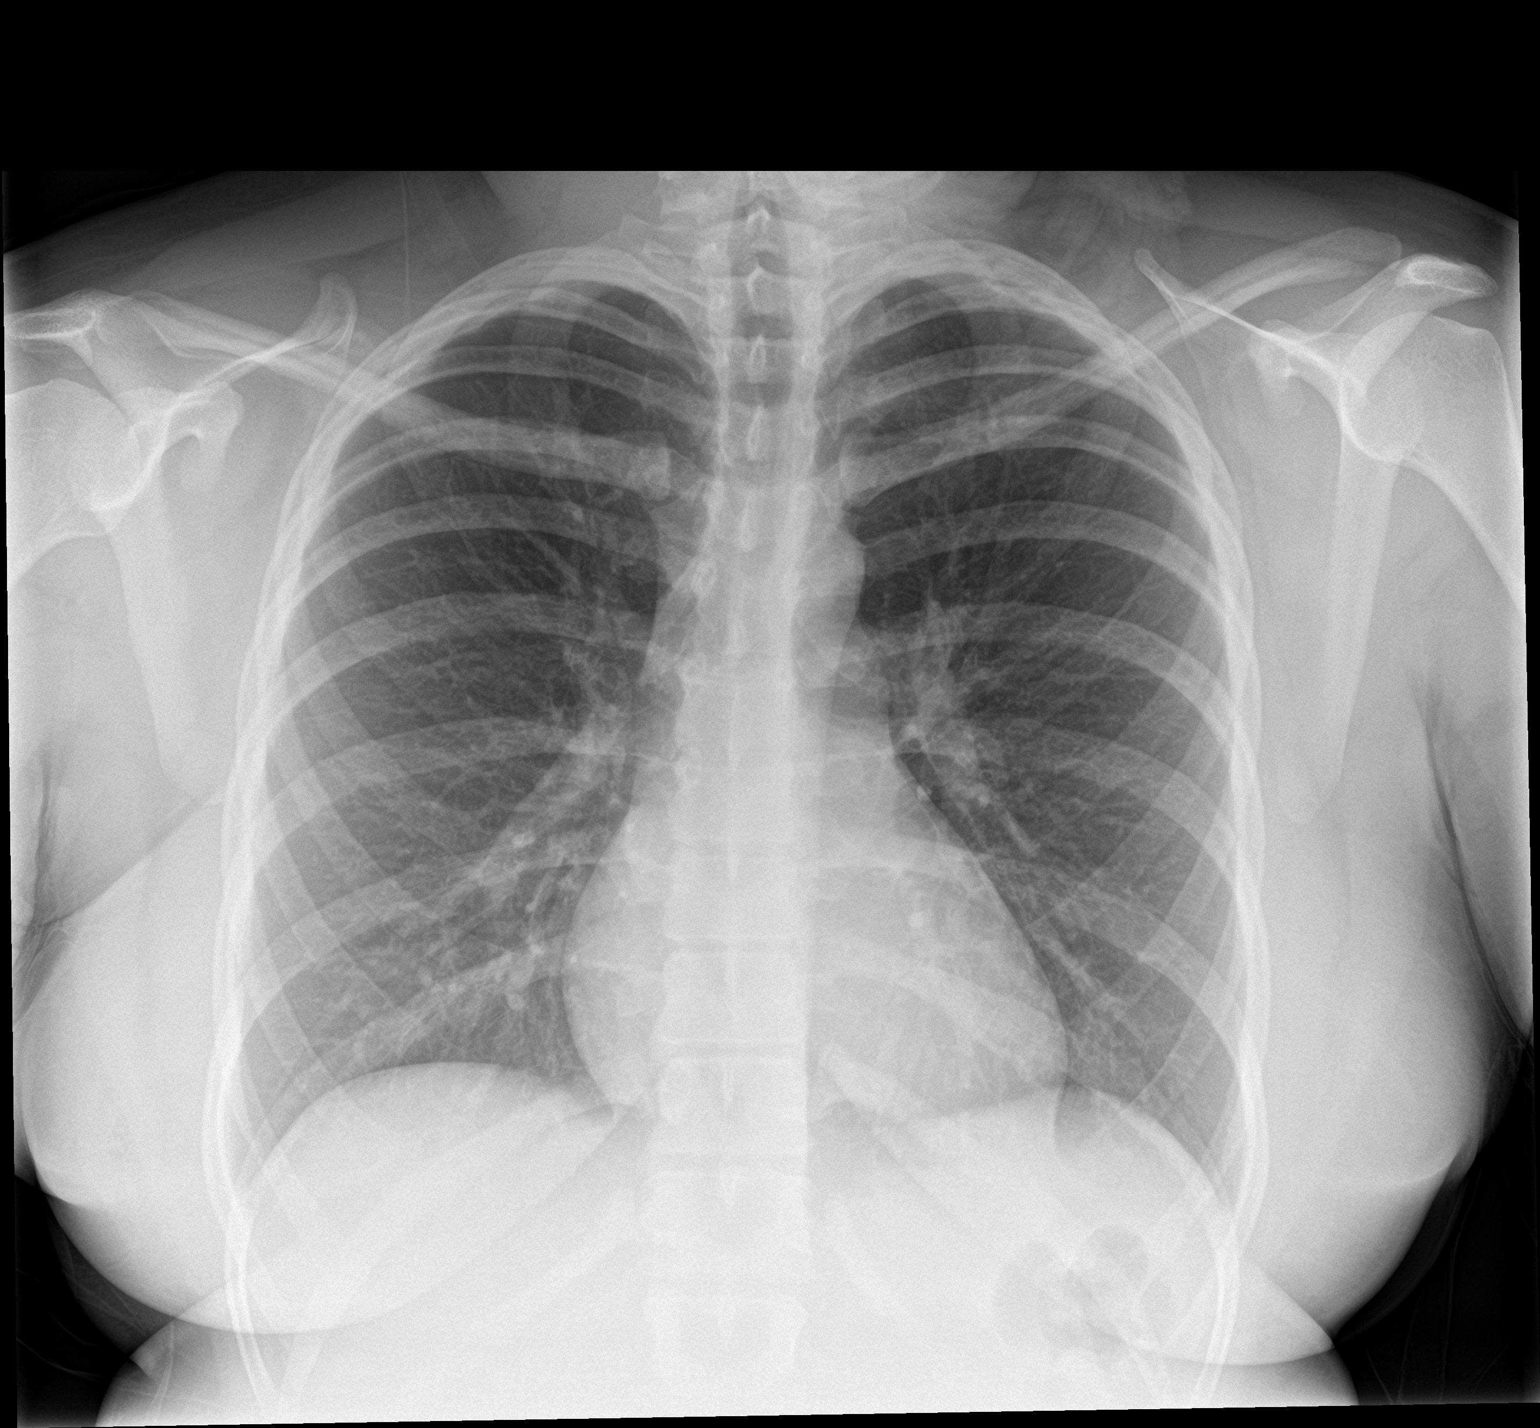

[chest lat]
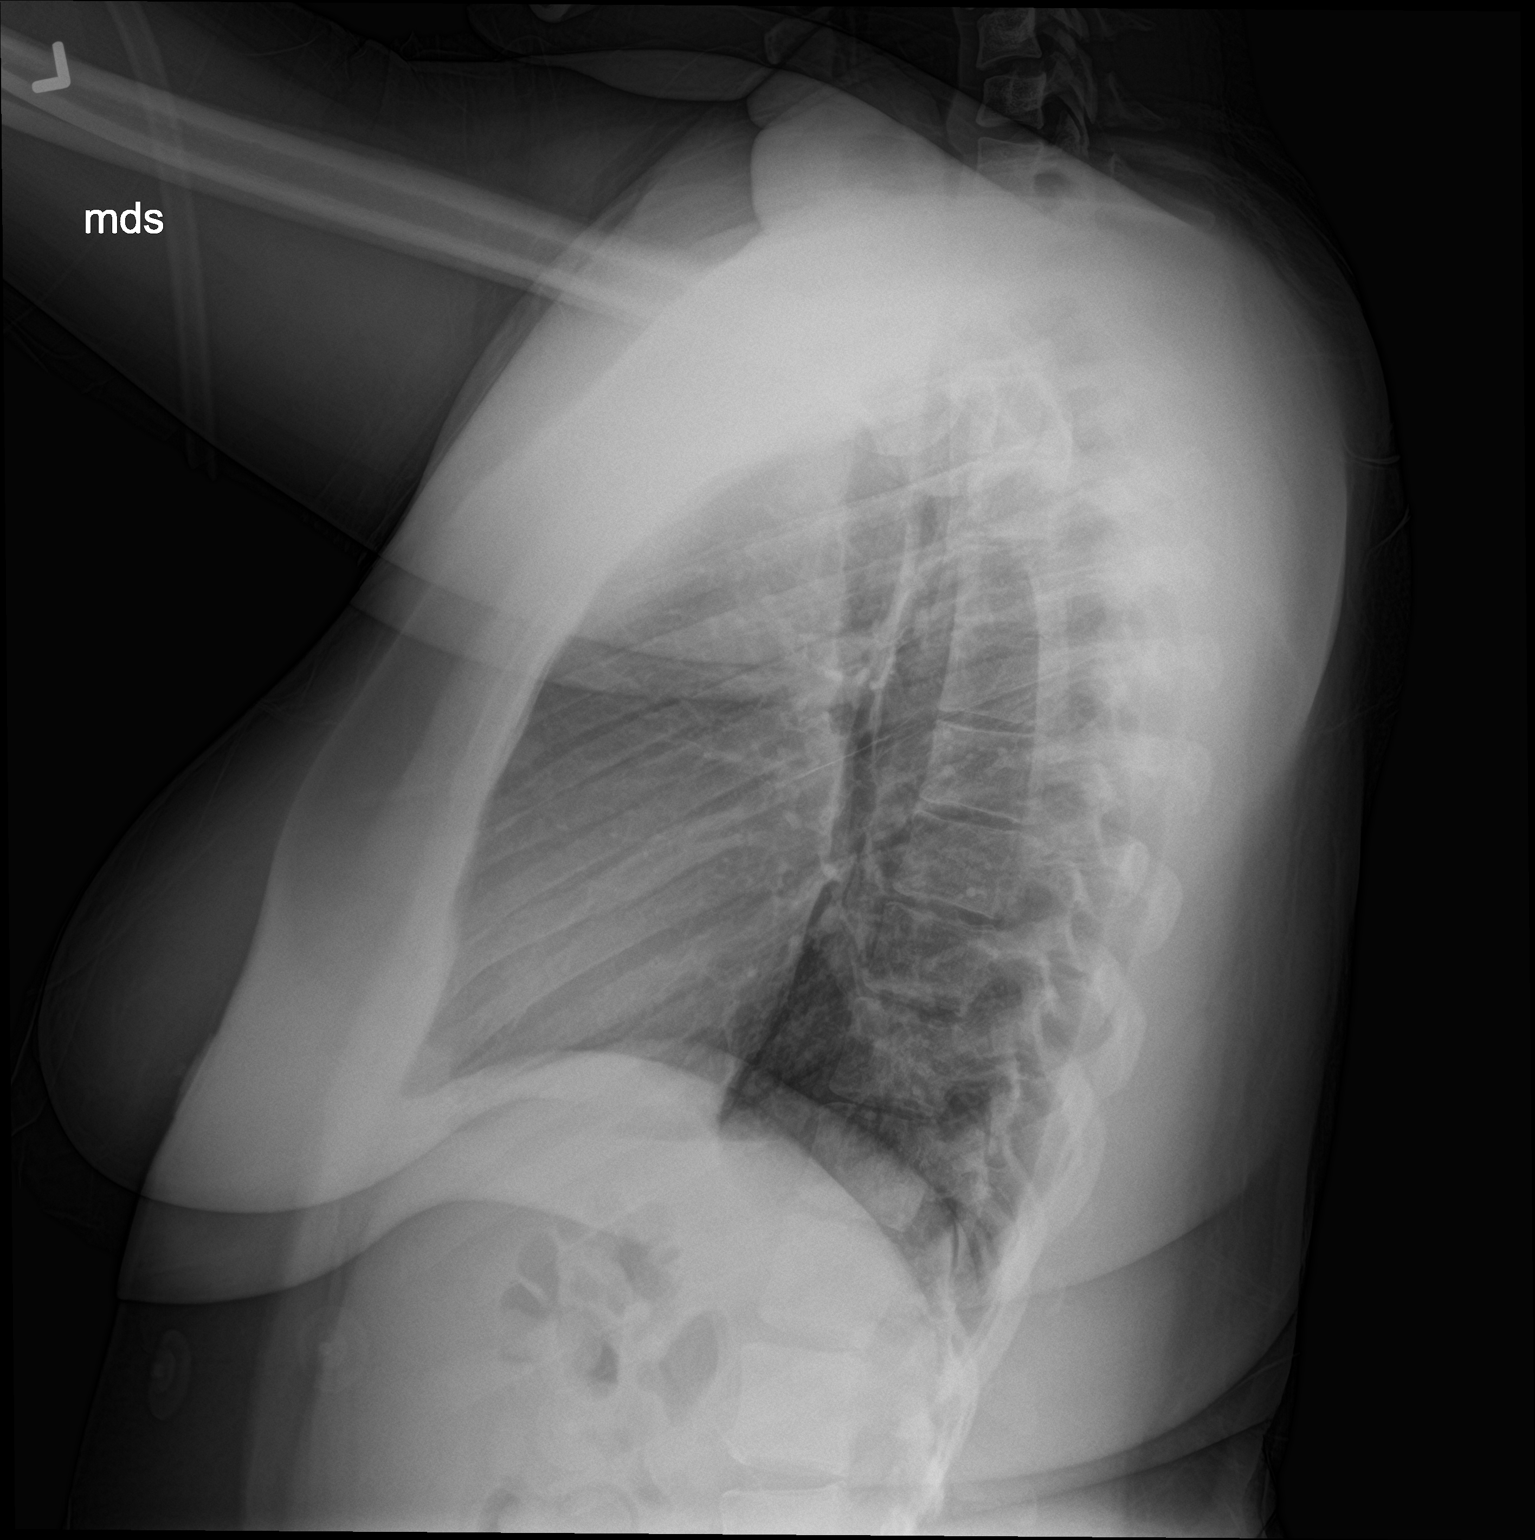

[2 of 2 positions shown; findings below may reference images not displayed]

FINDINGS: Cardiomediastinal silhouette is normal. No pleural effusions or
focal consolidations. Trachea projects midline and there is no
pneumothorax. Soft tissue planes and included osseous structures are
non-suspicious.
IMPRESSION: Chest negative.

## 2019-08-01 IMAGING — CR DG CHEST 2V
2 series · 2 of 2 positions shown · non-contrast
Comparison: 02/15/2018 and earlier.

CLINICAL DATA: 22-year-old female with intermittent chest pain,
lightheadedness and shortness of breath beginning a few days ago.
Palpitations.

EXAM:
CHEST - 2 VIEW

[chest pa]
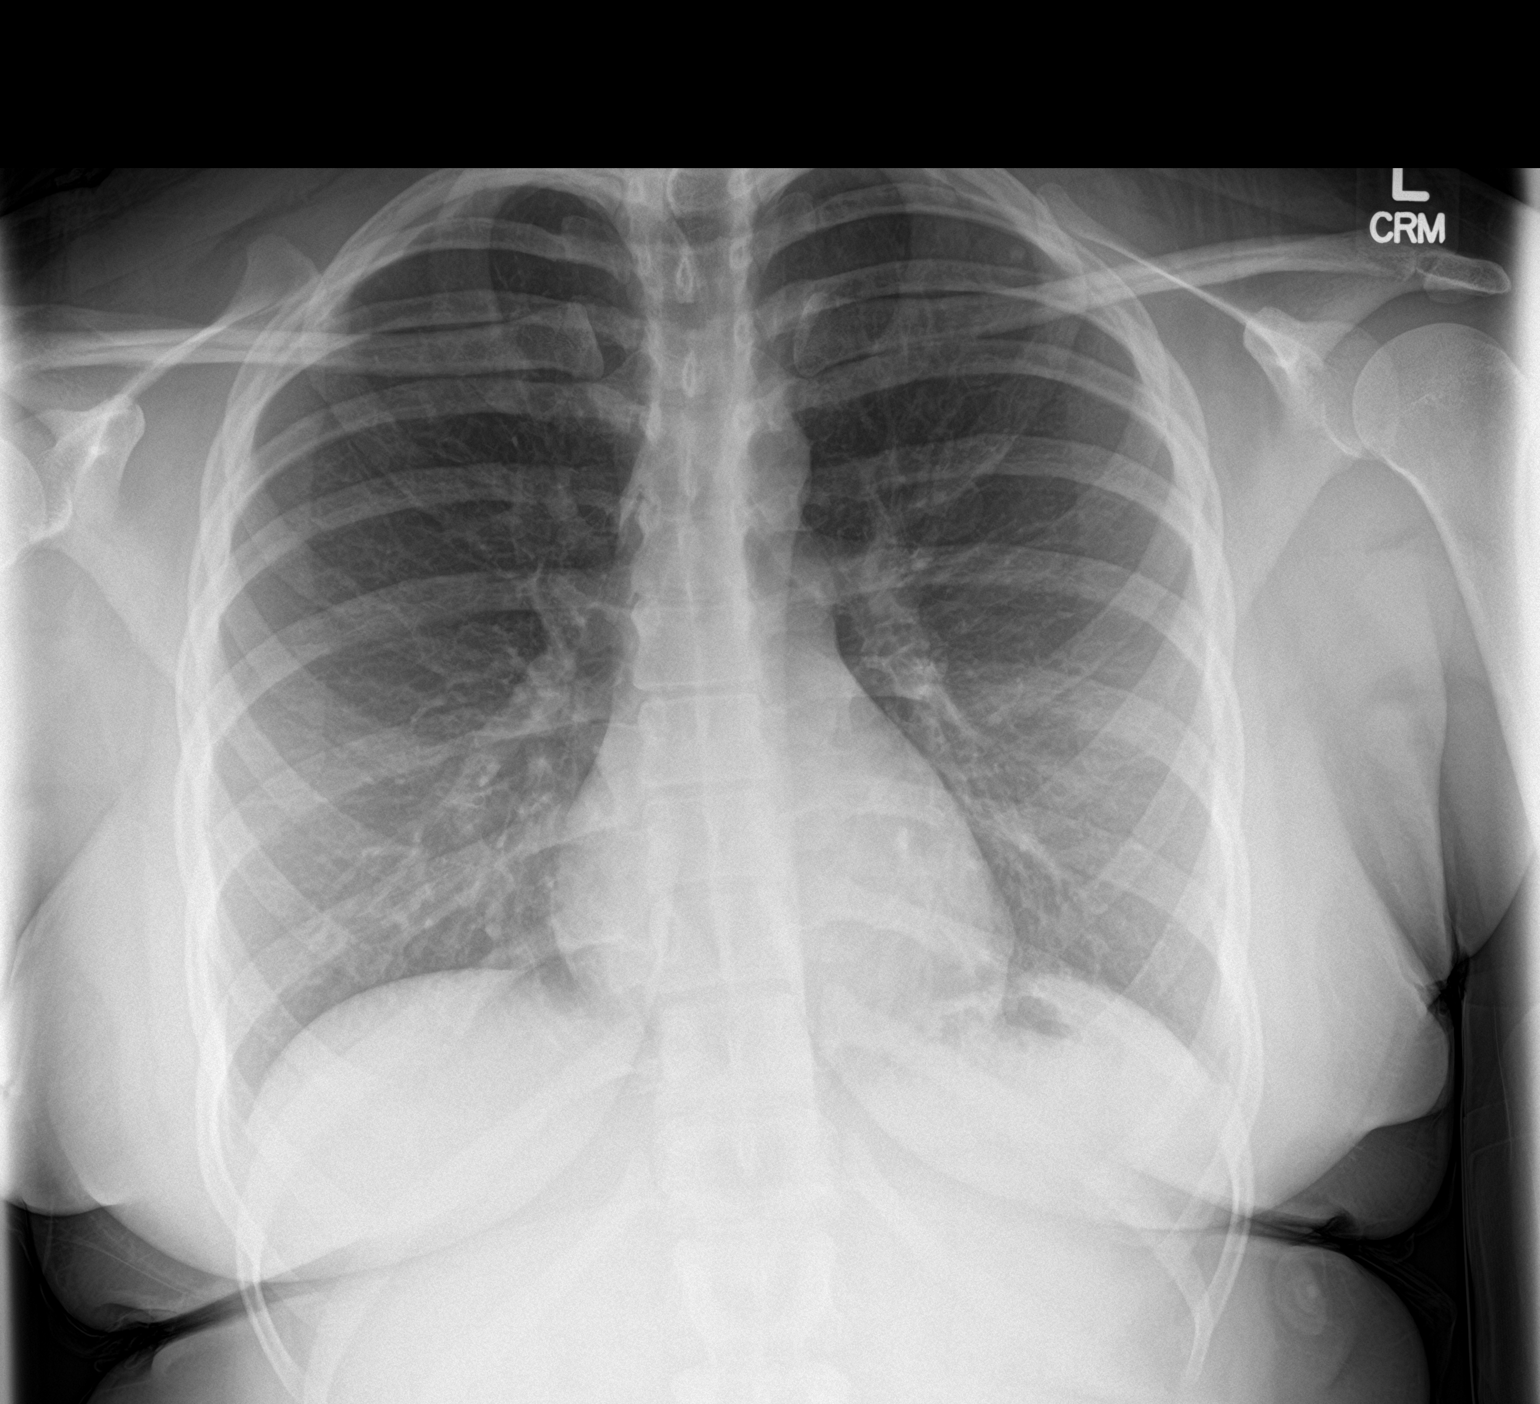

[chest lat]
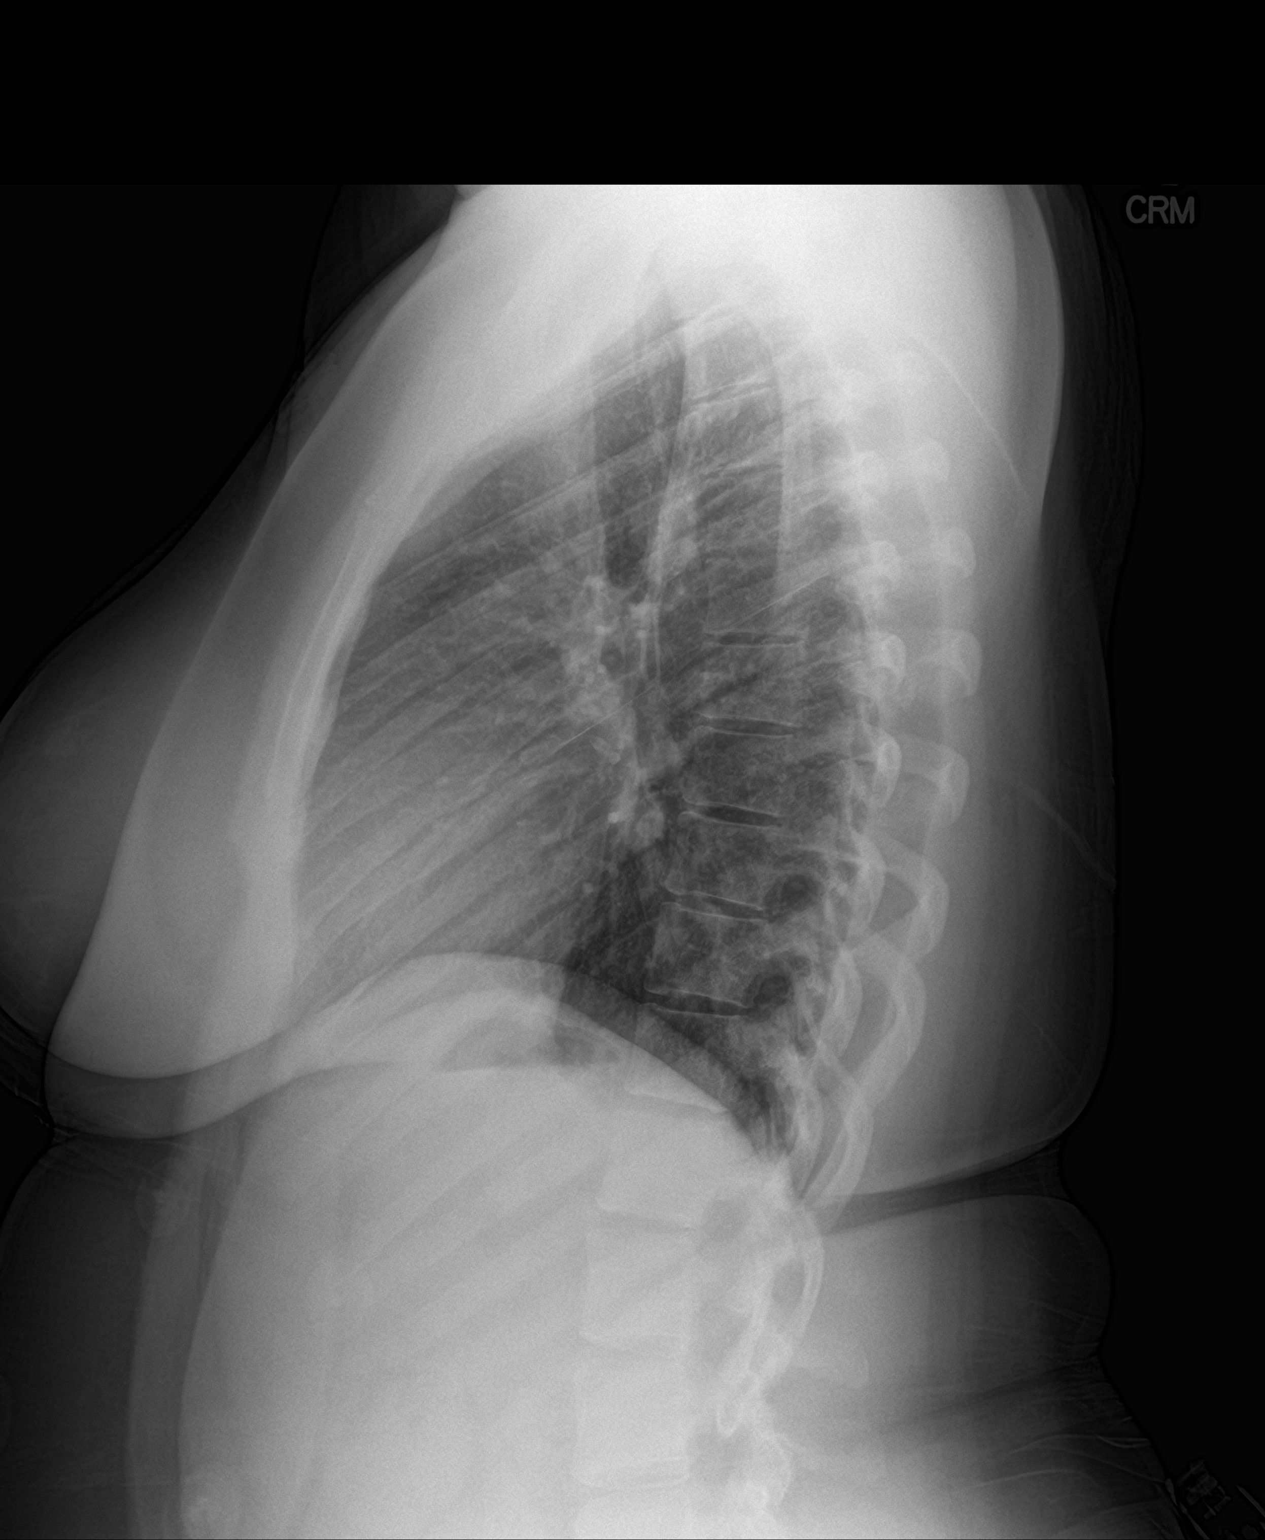

[2 of 2 positions shown; findings below may reference images not displayed]

FINDINGS: Lung volumes are stable and within normal limits. The heart size and
mediastinal contours are within normal limits. Visualized tracheal
air column is within normal limits. Both lungs are clear. No
pneumothorax or pleural effusion. The visualized skeletal structures
are unremarkable. Paucity of bowel gas aside from the gastric
fundus.
IMPRESSION: Negative.  No acute cardiopulmonary abnormality.

## 2020-03-24 ENCOUNTER — Encounter (HOSPITAL_COMMUNITY): Payer: Self-pay

## 2020-03-24 ENCOUNTER — Other Ambulatory Visit: Payer: Self-pay

## 2020-03-24 ENCOUNTER — Ambulatory Visit (HOSPITAL_COMMUNITY)
Admission: EM | Admit: 2020-03-24 | Discharge: 2020-03-24 | Disposition: A | Discharge status: Home / Self Care | Payer: No Typology Code available for payment source | Attending: Emergency Medicine | Admitting: Emergency Medicine

## 2020-03-24 DIAGNOSIS — M545 Low back pain: Secondary | ICD-10-CM

## 2020-03-24 DIAGNOSIS — G8929 Other chronic pain: Secondary | ICD-10-CM

## 2020-03-24 MED ORDER — CYCLOBENZAPRINE HCL 10 MG PO TABS: 10.0000 mg | ORAL_TABLET | Freq: Two times a day (BID) | ORAL | 0 refills | Status: DC | PRN

## 2020-03-24 NOTE — ED Provider Notes (Signed)
[facility]    CSN: [id number] Arrival date & time: 03/24/20  1252      History   Chief Complaint Chief Complaint  Patient presents with  . Back Pain    HPI Julia Michael is a 24 y.o. female. She presents with lower back pain that she has had to a year. Pain is worse with laying flat and with standing up for long periods. Denies numbness, tingling or weakness of her distal extremities. Denies urinary symptoms. Denies abscesses or drainage. Denies trauma to her back.   HPI  Past Medical History:  Diagnosis Date  . Anxiety   . Diabetes mellitus without complication ([facility])   . PCOS (polycystic ovarian syndrome)     There are no problems to display for this patient.   History reviewed. No pertinent surgical history.  OB History   No obstetric history on file.      Home Medications    Prior to Admission medications   Medication Sig Start Date End Date Taking? Authorizing Provider  acetaminophen (TYLENOL) 500 MG tablet Take 2 tablets (1,000 mg total) by mouth every 6 (six) hours as needed. Patient taking differently: Take 1,000 mg by mouth every 6 (six) hours as needed (for headaches).  09/03/16   Cheri Fowler, PA-C  benzonatate (TESSALON) 100 MG capsule Take 1 capsule (100 mg total) by mouth 3 (three) times daily as needed for cough. Patient not taking: Reported on 03/23/2018 09/03/16   Cheri Fowler, PA-C  cetirizine (ZYRTEC) 10 MG tablet Take 10 mg by mouth daily as needed for allergies.    [provider]  cyclobenzaprine (FLEXERIL) 10 MG tablet Take 1 tablet (10 mg total) by mouth 3 (three) times daily as needed for muscle spasms. Patient not taking: Reported on 03/23/2018 08/03/16   Gilda Crease, MD  cyclobenzaprine (FLEXERIL) 10 MG tablet Take 1 tablet (10 mg total) by mouth 2 (two) times daily as needed for muscle spasms. 03/24/20   Chilton Si, PA-C  diphenhydrAMINE (BENADRYL) 25 MG tablet Take 2 tablets (50 mg total) by mouth every 4  (four) hours as needed for itching. Patient taking differently: Take 25-50 mg by mouth every 4 (four) hours as needed (for allerrgies).  08/02/15   Cartner, Sharlet Salina, PA-C  esomeprazole (NEXIUM) 20 MG capsule Take 1 capsule (20 mg total) by mouth daily. 03/23/18   Dartha Lodge, PA-C  famotidine (PEPCID) 20 MG tablet Take 1 tablet (20 mg total) by mouth 2 (two) times daily. Patient not taking: Reported on 03/23/2018 02/15/18   Dartha Lodge, PA-C  metFORMIN (GLUCOPHAGE-XR) 500 MG 24 hr tablet Take 500 mg by mouth daily.  01/05/18   [provider]  potassium chloride (K-DUR) 10 MEQ tablet Take 1 tablet (10 mEq total) by mouth 2 (two) times daily for 5 days. Patient not taking: Reported on 03/23/2018 10/15/17 03/23/18  Joy, Hillard Danker, PA-C  traMADol (ULTRAM) 50 MG tablet Take 1 tablet (50 mg total) by mouth every 6 (six) hours as needed. Patient not taking: Reported on 03/23/2018 08/03/16   Gilda Crease, MD    Family History No family history on file.  Social History Social History   Tobacco Use  . Smoking status: Never Smoker  . Smokeless tobacco: Never Used  Substance Use Topics  . Alcohol use: Yes  . Drug use: No     Allergies   Ibuprofen and Pecan nut (diagnostic)   Review of Systems Review of Systems  Constitutional: Negative for chills  and fever.  Cardiovascular: Negative for chest pain.  Gastrointestinal: Negative for abdominal pain, diarrhea, nausea and vomiting.  Genitourinary: Negative for difficulty urinating, dysuria and frequency.  Musculoskeletal: Positive for back pain and myalgias. Negative for arthralgias, gait problem and joint swelling.  Neurological: Negative for dizziness, weakness and numbness.  Hematological: Does not bruise/bleed easily.  All other systems reviewed and are negative.    Physical Exam Triage Vital Signs ED Triage Vitals  Enc Vitals Group     BP 03/24/20 1325 122/65     Pulse Rate 03/24/20 1325 79     Resp 03/24/20 1325 18      Temp 03/24/20 1325 98.8 F (37.1 C)     Temp Source 03/24/20 1325 Oral     SpO2 03/24/20 1325 100 %     Weight 03/24/20 1328 190 lb (86.2 kg)     Height --      Head Circumference --      Peak Flow --      Pain Score 03/24/20 1328 8     Pain Loc --      Pain Edu? --      Excl. in GC? --    No data found.  Updated Vital Signs BP 122/65 (BP Location: Right Arm)   Pulse 79   Temp 98.8 F (37.1 C) (Oral)   Resp 18   Wt 190 lb (86.2 kg)   LMP 02/08/2020   SpO2 100%   BMI 33.66 kg/m    Physical Exam Vitals reviewed.  Constitutional:      General: She is not in acute distress.    Appearance: Normal appearance. She is not ill-appearing.  HENT:     Head: Normocephalic and atraumatic.     Right Ear: External ear normal.     Left Ear: External ear normal.     Nose: Nose normal.  Eyes:     General: No scleral icterus.       Right eye: No discharge.        Left eye: No discharge.  Cardiovascular:     Pulses: Normal pulses.  Pulmonary:     Effort: Pulmonary effort is normal. No respiratory distress.  Musculoskeletal:        General: No swelling or tenderness.     Cervical back: Normal range of motion and neck supple.     Thoracic back: Normal.     Lumbar back: Normal. No swelling, signs of trauma, spasms, tenderness or bony tenderness. Normal range of motion. No scoliosis.     Right lower leg: No edema.     Left lower leg: No edema.  Skin:    General: Skin is warm and dry.     Capillary Refill: Capillary refill takes less than 2 seconds.     Coloration: Skin is not jaundiced or pale.  Neurological:     General: No focal deficit present.     Mental Status: She is alert.      UC Treatments / Results    Initial Impression / Assessment and Plan / UC Course  I have reviewed the triage vital signs and the nursing notes.  Pertinent labs & imaging results that were available during my care of the patient were reviewed by me and considered in my medical decision making  (see chart for details).     Chronic low back pain: Recommended tylenol and flexeril for back pain. Since back pain has been chronic and she has not had it evaluated, I recommended referral to  orthopedic for further evaluation.    Final Clinical Impressions(s) / UC Diagnoses   Final diagnoses:  Chronic low back pain without sciatica, unspecified back pain laterality   Discharge Instructions   None    ED Prescriptions    Medication Sig Dispense Auth. Provider   cyclobenzaprine (FLEXERIL) 10 MG tablet Take 1 tablet (10 mg total) by mouth 2 (two) times daily as needed for muscle spasms. 20 tablet Domingo Dimes, [place]     PDMP not reviewed this encounter.   Domingo Dimes, PA-C 03/24/20 1438

## 2020-03-24 NOTE — ED Triage Notes (Signed)
Pt states she has been having lower back spasms x 4 days. Pt states she has been taking tylenol for relief.

## 2020-03-27 ENCOUNTER — Other Ambulatory Visit: Payer: Self-pay

## 2020-03-27 ENCOUNTER — Ambulatory Visit (INDEPENDENT_AMBULATORY_CARE_PROVIDER_SITE_OTHER): Payer: No Typology Code available for payment source | Admitting: Family Medicine

## 2020-03-27 ENCOUNTER — Ambulatory Visit (INDEPENDENT_AMBULATORY_CARE_PROVIDER_SITE_OTHER): Payer: No Typology Code available for payment source

## 2020-03-27 ENCOUNTER — Encounter: Payer: Self-pay | Admitting: Family Medicine

## 2020-03-27 DIAGNOSIS — M5442 Lumbago with sciatica, left side: Secondary | ICD-10-CM

## 2020-03-27 DIAGNOSIS — M542 Cervicalgia: Secondary | ICD-10-CM

## 2020-03-27 DIAGNOSIS — M5441 Lumbago with sciatica, right side: Secondary | ICD-10-CM

## 2020-03-27 MED ORDER — BACLOFEN 10 MG PO TABS: 5.0000 mg | ORAL_TABLET | Freq: Three times a day (TID) | ORAL | 3 refills | Status: DC | PRN

## 2020-03-27 NOTE — Progress Notes (Signed)
Office Visit Note   Patient: Julia Michael           Date of Birth: [birth year]           MRN: [id number] Visit Date: 03/27/2020 Requested by: Domingo Dimes [place],  [place] [address] PCP: Patient, No Pcp Per  Subjective: Chief Complaint  Patient presents with  . Lower Back - Pain    Pain middle of lower back x 2 weeks. NKI. Radiates down anterior thighs to knees. Has had an episode of tingling in the knees.  . Neck - Pain    Tightness in the neck x couple months. NKI. Has to "crack her neck." Does get a little relief from this. No pain radiating down the arms. No numbness/tingling.    HPI: She is here with neck and low back pain.  Her low back has bothered her the longest, started hurting about a year ago with no injury.  Midline pain when standing too long, with occasional radiation down the thighs into her knees.  No weakness or numbness, no bowel or bladder dysfunction, no fevers or chills.  When she sits down it feels better but sometimes it does not go away completely.  She has tried ibuprofen and Tylenol with only slight relief.  Her neck bothers her intermittently as well.  Tightness and stiffness in the neck but no radicular symptoms.  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  She notices occasional popping and cracking in her neck.              ROS: No bowel or bladder dysfunction.  All other systems were reviewed and are negative.  Objective: Vital Signs: There were no vitals taken for this visit.  Physical Exam:  General:  Alert and oriented, in no acute distress. Pulm:  Breathing unlabored. Psy:  Normal mood, congruent affect. Skin: No rash Neck: She has tightness in the paraspinous muscles especially around C3-4 and C4-5 bilaterally.  Good range of motion with negative Spurling's test.  She sits with head forward posture. Low back: No scoliosis.  She is tender in the midline over the L5-S1 level.   Negative straight leg raise, no pain with internal/external hip rotation.  Lower extremity strength and reflexes are normal.  Imaging: XR Cervical Spine 2 or 3 views  Result Date: 03/27/2020 X-ray cervical spine reveal straightening of the spine, almost a reverse curve.  No degenerative changes seen, no congenital deformities.  XR Lumbar Spine 2-3 Views  Result Date: 03/27/2020 X-rays lumbar spine: Mild L5-S1 degenerative disc disease.  Otherwise unremarkable.  No sign of compression deformity or neoplasm.   Assessment & Plan: 1.  Chronic low back pain possibly due to L5-S1 degenerative disc disease. -Physical therapy referral.  Baclofen as needed.  She cannot take NSAIDs. -Consider MRI scan if she fails to improve.  2.  Chronic neck pain probably due to poor posture -Physical therapy, follow-up as needed.     Procedures: No procedures performed  No notes on file     PMFS History: There are no problems to display for this patient.  Past Medical History:  Diagnosis Date  . Anxiety   . Diabetes mellitus without complication ([place])   . PCOS (polycystic ovarian syndrome)     History reviewed. No pertinent family history.  History reviewed. No pertinent surgical history. Social History   Occupational History  . Not on file  Tobacco Use  . Smoking  status: Never Smoker  . Smokeless tobacco: Never Used  Substance and Sexual Activity  . Alcohol use: Yes  . Drug use: No  . Sexual activity: Yes    Birth control/protection: Condom

## 2020-03-27 NOTE — Patient Instructions (Signed)
   Back:  Early degenerative disc disease at L5-S1.  Need to get pain controlled, then work on core strength and good posture for long-term maintenance.  Neck:  Poor posture causing muscle imbalance.

## 2020-04-03 ENCOUNTER — Other Ambulatory Visit: Payer: Self-pay

## 2020-04-03 ENCOUNTER — Ambulatory Visit (INDEPENDENT_AMBULATORY_CARE_PROVIDER_SITE_OTHER): Payer: No Typology Code available for payment source | Admitting: Physical Therapy

## 2020-04-03 ENCOUNTER — Encounter: Payer: Self-pay | Admitting: Physical Therapy

## 2020-04-03 DIAGNOSIS — M542 Cervicalgia: Secondary | ICD-10-CM

## 2020-04-03 DIAGNOSIS — M5417 Radiculopathy, lumbosacral region: Secondary | ICD-10-CM

## 2020-04-03 DIAGNOSIS — R293 Abnormal posture: Secondary | ICD-10-CM

## 2020-04-03 NOTE — Patient Instructions (Signed)
Access Code: [email or web address] URL: [email or web address] Date: 04/03/2020 Prepared by: Moshe Cipro  Exercises Seated Hamstring Stretch - 2 x daily - 7 x weekly - 3 reps - 1 sets - 30 sec hold Seated Single Knee to Chest - 2 x daily - 7 x weekly - 1 sets - 3 reps - 30 sec hold Seated Pelvic Tilt - 2 x daily - 7 x weekly - 1 sets - 10 reps - 5 sec hold Seated Piriformis Stretch with Trunk Bend - 2 x daily - 7 x weekly - 1 sets - 3 reps - 30 sec hold Hooklying Single Knee to Chest - 2 x daily - 7 x weekly - 3 reps - 1 sets - 30 sec hold Supine Posterior Pelvic Tilt - 2 x daily - 7 x weekly - 10 reps - 1 sets - 5 sec hold Supine Piriformis Stretch with Foot on Ground - 2 x daily - 7 x weekly - 3 reps - 1 sets - 30 sec hold

## 2020-04-03 NOTE — Therapy (Signed)
[facility] Physical Therapy [address] [place], [place], [address] Phone: [phone]   Fax:  [phone]  Physical Therapy Evaluation  Patient Details  Name: Julia Michael MRN: [id number] Date of Birth: [birth year] Referring Provider (PT): Lavada Mesi, MD   Encounter Date: 04/03/2020  PT End of Session - 04/03/20 1241    Visit Number  1    Number of Visits  12    Date for PT Re-Evaluation  05/15/20    PT Start Time  1145    PT Stop Time  1220    PT Time Calculation (min)  35 min    Activity Tolerance  Patient tolerated treatment well    Behavior During Therapy  [facility] for tasks assessed/performed       Past Medical History:  Diagnosis Date  . Anxiety   . Diabetes mellitus without complication (HCC)   . PCOS (polycystic ovarian syndrome)     History reviewed. No pertinent surgical history.  There were no vitals filed for this visit.   Subjective Assessment - 04/03/20 1148    Subjective  Pt is a 24 y/o female who presents to OPPT for LBP x 1 year which is worse with lying flat or standing for "too long."  Pt without known injury to low back.    Limitations  Walking;Standing    How long can you stand comfortably?  10-15 min    How long can you walk comfortably?  "it depends" - reports factors that impact are where she's at or what she's doing    Diagnostic tests  xrays: mild DDD at L5-S1    Patient Stated Goals  improve pain, walk without pain    Currently in Pain?  Yes    Pain Score  5    up to 10/10, at best 5/10   Pain Location  Sacrum    Pain Orientation  Lower    Pain Descriptors / Indicators  Aching;Shooting    Pain Type  Chronic pain    Pain Radiating Towards  to bil knees    Pain Onset  More than a month ago    Pain Frequency  Constant    Aggravating Factors   lying supine, standing, walking    Pain Relieving Factors  forward bending         [facility] PT Assessment - 04/03/20 1153      Assessment   Medical Diagnosis   M54.42,M54.41 (ICD-10-CM) - Acute midline low back pain with bilateral sciatica    Referring Provider (PT)  Hilts, Michael, MD    Onset Date/Surgical Date  --   chronic x 1 yr   Hand Dominance  Right    Next MD Visit  PRN    Prior Therapy  none      Precautions   Precautions  None      Restrictions   Weight Bearing Restrictions  No      Balance Screen   Has the patient fallen in the past 6 months  No    Has the patient had a decrease in activity level because of a fear of falling?   No    Is the patient reluctant to leave their home because of a fear of falling?   No      Home Environment   Living Environment  Private residence    Living Arrangements  Alone    Type of Home  Apartment    Home Access  Stairs to enter    Home Layout  One  level      Prior Function   Level of Independence  Independent    Vocation  Full time employment    [employer] - works from home, seated at computer, able to take frequent breaks    Leisure  spend time with friends      Cognition   Overall Cognitive Status  Within Functional Limits for tasks assessed      Posture/Postural Control   Posture/Postural Control  Postural limitations    Postural Limitations  Rounded Shoulders;Forward head;Anterior pelvic tilt;Increased lumbar lordosis      ROM / Strength   AROM / PROM / Strength  AROM;Strength      AROM   AROM Assessment Site  Lumbar    Lumbar Flexion  WNL - directional preference    Lumbar Extension  WNl - no change with repetition    Lumbar - Right Side Bend  WNL    Lumbar - Left Side Bend  WNL    Lumbar - Right Rotation  WNL    Lumbar - Left Rotation  WNL      Strength   Overall Strength  Within functional limits for tasks performed    Strength Assessment Site  --      Flexibility   Soft Tissue Assessment /Muscle Length  yes    Hamstrings  tightness bil    Piriformis  tightness vil      Palpation   Spinal mobility  lumbar spine WNL    Palpation  comment  tenderness along sacrum, no trigger points noted      Special Tests    Special Tests  Lumbar    Lumbar Tests  Slump Test      Slump test   Findings  Negative                  Objective measurements completed on examination: See above findings.      [facility] Adult PT Treatment/Exercise - 04/03/20 1153      Exercises   Exercises  Other Exercises    Other Exercises   see HEP, performed 1-2 reps of all sitting exercises             PT Education - 04/03/20 1241    Education Details  HEP    Person(s) Educated  Patient    Methods  Explanation;Demonstration;Handout    Comprehension  Verbalized understanding;Returned demonstration;Need further instruction       PT Short Term Goals - 04/03/20 1245      PT SHORT TERM GOAL #1   Title  perform formal neck assessment with goals to be written    Status  New    Target Date  04/17/20        PT Long Term Goals - 04/03/20 1246      PT LONG TERM GOAL #1   Title  independent with HEP    Status  New    Target Date  05/15/20      PT LONG TERM GOAL #2   Title  report pain < 5/10 with standing and walking > 30 min for improved mobility and job responsibilities    Status  New    Target Date  05/15/20      PT LONG TERM GOAL #3   Title  demonstrate improved postural awareness for improved tolerance with work activities    Status  New    Target Date  05/15/20  Plan - 04/03/20 1241    Clinical Impression Statement  Pt is a 24 y/o female who presents to OPPT for chronic LBP x 1 year without known injury.  Exam mostly benign with some mild flexibility and postural limitations, and pain with palpation along sacrum.  Will benefit from PT to address deficits noted.  Pt also with neck pain, and plan to address next session.    Personal Factors and Comorbidities  Comorbidity 2    Comorbidities  anxiety, DM    Examination-Activity Limitations  Stand;Sleep;Sit;Stairs    Examination-Participation  Restrictions  Community Activity;Other   occupation   Stability/Clinical Decision Making  Stable/Uncomplicated    Clinical Decision Making  Low    Rehab Potential  Good    PT Frequency  2x / week    PT Duration  6 weeks    PT Treatment/Interventions  ADLs/Self Care Home Management;Cryotherapy;Ultrasound;Traction;Moist Heat;Electrical Stimulation;Gait training;Stair training;Functional mobility training;Therapeutic activities;Patient/family education;Neuromuscular re-education;Therapeutic exercise;Manual techniques;Dry needling;Taping    PT Next Visit Plan  review HEP, assess neck and establish HEP and goals    PT Home Exercise Plan  Access Code: [email or web address]       Patient will benefit from skilled therapeutic intervention in order to improve the following deficits and impairments:  Pain, Postural dysfunction, Improper body mechanics, Impaired flexibility, Decreased endurance, Decreased activity tolerance  Visit Diagnosis: Radiculopathy, lumbosacral region - Plan: PT plan of care cert/re-cert  Abnormal posture - Plan: PT plan of care cert/re-cert  Cervicalgia - Plan: PT plan of care cert/re-cert     Problem List There are no problems to display for this patient.      Clarita Crane, PT, DPT 04/03/20 12:49 PM     [facility] Physical Therapy [address] [place], [place], [address] Phone: [phone]   Fax:  [phone]  Name: Julia Michael MRN: [id number] Date of Birth: 1996/01/17

## 2020-04-06 ENCOUNTER — Ambulatory Visit: Payer: No Typology Code available for payment source | Admitting: Physical Therapy

## 2020-04-10 ENCOUNTER — Encounter: Payer: Self-pay | Admitting: Physical Therapy

## 2020-04-10 ENCOUNTER — Other Ambulatory Visit: Payer: Self-pay

## 2020-04-10 ENCOUNTER — Ambulatory Visit (INDEPENDENT_AMBULATORY_CARE_PROVIDER_SITE_OTHER): Payer: No Typology Code available for payment source | Admitting: Physical Therapy

## 2020-04-10 DIAGNOSIS — M5417 Radiculopathy, lumbosacral region: Secondary | ICD-10-CM

## 2020-04-10 DIAGNOSIS — M542 Cervicalgia: Secondary | ICD-10-CM

## 2020-04-10 DIAGNOSIS — R293 Abnormal posture: Secondary | ICD-10-CM

## 2020-04-10 NOTE — Therapy (Signed)
[place], [place], [address] Phone: [phone]   Fax:  [phone]  Physical Therapy Treatment  Patient Details  Name: Julia Michael MRN: [id number] Date of Birth: Sep 01, 1996 Referring Provider (PT): Eunice Blase, MD   Encounter Date: 04/10/2020  PT End of Session - 04/10/20 0831    Visit Number  2    Number of Visits  12    Date for PT Re-Evaluation  05/15/20    PT Start Time  0801    PT Stop Time  0842    PT Time Calculation (min)  41 min    Activity Tolerance  Patient tolerated treatment well    Behavior During Therapy  [facility] for tasks assessed/performed       Past Medical History:  Diagnosis Date  . Anxiety   . Diabetes mellitus without complication ([place])   . PCOS (polycystic ovarian syndrome)     History reviewed. No pertinent surgical history.  There were no vitals filed for this visit.  Subjective Assessment - 04/10/20 0803    Subjective  back "feels a little teeny bit better."  reports neck pain began about a year ago, and c/o feeling stiff and that "I always have to crack it."    Limitations  Walking;Standing    How long can you stand comfortably?  10-15 min    How long can you walk comfortably?  "it depends" - reports factors that impact are where she's at or what she's doing    Diagnostic tests  xrays: mild DDD at L5-S1    Patient Stated Goals  improve pain, walk without pain    Currently in Pain?  Yes    Pain Score  5     Pain Location  Sacrum    Pain Orientation  Lower    Pain Descriptors / Indicators  Aching;Shooting    Pain Type  Chronic pain    Pain Radiating Towards  to bil knees    Pain Onset  More than a month ago    Pain Frequency  Constant    Aggravating Factors   lying supine, standing, walking    Pain Relieving Factors  forward bending    Multiple Pain Sites  Yes    Pain Score  7   up to 7/10, at best 4/10   Pain Location  Neck    Pain Orientation  Posterior;Upper    Pain  Descriptors / Indicators  Tightness;Aching    Pain Type  Chronic pain    Pain Onset  More than a month ago    Pain Frequency  Constant    Aggravating Factors   "it just gets stiff"    Pain Relieving Factors  popping it         [facility] Assessment - 04/10/20 0807      Assessment   Medical Diagnosis  M54.42,M54.41 (ICD-10-CM) - Acute midline low back pain with bilateral sciatica    Referring Provider (PT)  Hilts, Michael, MD    Hand Dominance  Right    Next MD Visit  PRN    Prior Therapy  none      AROM   Overall AROM Comments  no pain with any movement    AROM Assessment Site  Cervical    Cervical Flexion  30    Cervical Extension  28    Cervical - Right Side Bend  32    Cervical - Left Side Bend  42    Cervical - Right Rotation  67    Cervical - Left Rotation  60      Strength   Strength Assessment Site  Shoulder;Elbow   WNL                   [facility] Adult PT Treatment/Exercise - 04/10/20 0830      Exercises   Exercises  Neck;Lumbar      Neck Exercises: Seated   Shoulder Rolls  Backwards;10 reps    Other Seated Exercise  scapular retraction 5x5 sec hol      Neck Exercises: Supine   Neck Retraction  10 reps;5 secs      Lumbar Exercises: Stretches   Piriformis Stretch  Right;Left;2 reps;30 seconds      Lumbar Exercises: Supine   Pelvic Tilt  10 reps;5 seconds      Neck Exercises: Stretches   Other Neck Stretches  mid doorway stretch 1x30 ec             PT Education - 04/10/20 0831    Education Details  cervical HEP    Person(s) Educated  Patient    Methods  Explanation;Demonstration;Handout    Comprehension  Verbalized understanding;Returned demonstration       PT Short Term Goals - 04/10/20 0832      PT SHORT TERM GOAL #1   Title  perform formal neck assessment with goals to be written    Status  Achieved    Target Date  04/17/20        PT Long Term Goals - 04/10/20 0854      PT LONG TERM GOAL #1   Title  independent with  HEP    Status  On-going    Target Date  05/15/20      PT LONG TERM GOAL #2   Title  report pain < 5/10 with standing and walking > 30 min for improved mobility and job responsibilities    Status  On-going    Target Date  05/15/20      PT LONG TERM GOAL #3   Title  demonstrate improved postural awareness for improved tolerance with work activities    Status  On-going    Target Date  05/15/20      PT LONG TERM GOAL #4   Title  report neck pain < 4/10 for improved function    Status  New    Target Date  05/15/20      PT LONG TERM GOAL #5   Title  improve Lt cervical rotation to at least 65 deg for improved function    Status  New    Target Date  05/15/20            Plan - 04/10/20 0856    Clinical Impression Statement  Assessment of neck pain addressed today with pt demonstrating some mild ROM limitations with trigger points noted (did not reproduce pain) and decreased postural awareness.  HEP addressed today for neck needing mod cues to perform, and min cues with review of lumbar HEP.  Will continue to benefit from PT to maximize function.  STG #1 met today.    Personal Factors and Comorbidities  Comorbidity 2    Comorbidities  anxiety, DM    Examination-Activity Limitations  Stand;Sleep;Sit;Stairs    Examination-Participation Restrictions  Community Activity;Other   occupation   Stability/Clinical Decision Making  Stable/Uncomplicated    Rehab Potential  Good    PT Frequency  2x / week    PT Duration  6 weeks  PT Treatment/Interventions  ADLs/Self Care Home Management;Cryotherapy;Ultrasound;Traction;Moist Heat;Electrical Stimulation;Gait training;Stair training;Functional mobility training;Therapeutic activities;Patient/family education;Neuromuscular re-education;Therapeutic exercise;Manual techniques;Dry needling;Taping    PT Next Visit Plan  review cervical HEP, progress postural/back strengthening    PT Home Exercise Plan  Access Code: [email or web address]    Consulted and Agree  with Plan of Care  Patient       Patient will benefit from skilled therapeutic intervention in order to improve the following deficits and impairments:  Pain, Postural dysfunction, Improper body mechanics, Impaired flexibility, Decreased endurance, Decreased activity tolerance  Visit Diagnosis: Radiculopathy, lumbosacral region  Abnormal posture  Cervicalgia     Problem List There are no problems to display for this patient.     Laureen Abrahams, PT, DPT 04/10/20 8:59 AM     [facility] Physical Therapy [address] [place], [place], [address] Phone: [phone]   Fax:  [phone]  Name: Julia Michael MRN: [id number] Date of Birth: 11/25/1995

## 2020-04-10 NOTE — Patient Instructions (Signed)
Access Code: [email or web address] URL: [email or web address] Date: 04/03/2020 Prepared by: Moshe Cipro  Exercises Seated Hamstring Stretch - 2 x daily - 7 x weekly - 3 reps - 1 sets - 30 sec hold Seated Single Knee to Chest - 2 x daily - 7 x weekly - 1 sets - 3 reps - 30 sec hold Seated Pelvic Tilt - 2 x daily - 7 x weekly - 1 sets - 10 reps - 5 sec hold Seated Piriformis Stretch with Trunk Bend - 2 x daily - 7 x weekly - 1 sets - 3 reps - 30 sec hold Hooklying Single Knee to Chest - 2 x daily - 7 x weekly - 3 reps - 1 sets - 30 sec hold Supine Posterior Pelvic Tilt - 2 x daily - 7 x weekly - 10 reps - 1 sets - 5 sec hold Supine Piriformis Stretch with Foot on Ground - 2 x daily - 7 x weekly - 3 reps - 1 sets - 30 sec hold Supine Chin Tuck - 2 x daily - 7 x weekly - 10 reps - 1 sets - 5 sec hold Standing Backward Shoulder Rolls - 2 x daily - 7 x weekly - 10 reps - 1 sets Seated Scapular Retraction - 2 x daily - 7 x weekly - 10 reps - 1 sets - 5 sec hold Doorway Pec Stretch at 60 Elevation - 2 x daily - 7 x weekly - 3 reps - 1 sets - 30 sec hold

## 2020-04-12 ENCOUNTER — Encounter: Payer: Self-pay | Admitting: Physical Therapy

## 2020-04-12 ENCOUNTER — Other Ambulatory Visit: Payer: Self-pay

## 2020-04-12 ENCOUNTER — Ambulatory Visit (INDEPENDENT_AMBULATORY_CARE_PROVIDER_SITE_OTHER): Payer: No Typology Code available for payment source | Admitting: Physical Therapy

## 2020-04-12 DIAGNOSIS — M542 Cervicalgia: Secondary | ICD-10-CM | POA: Diagnosis not present

## 2020-04-12 DIAGNOSIS — M5417 Radiculopathy, lumbosacral region: Secondary | ICD-10-CM

## 2020-04-12 DIAGNOSIS — R293 Abnormal posture: Secondary | ICD-10-CM | POA: Diagnosis not present

## 2020-04-12 NOTE — Therapy (Signed)
[place], [place], [address] Phone: [phone]   Fax:  [phone]  Physical Therapy Treatment  Patient Details  Name: Julia Michael MRN: [id number] Date of Birth: February 27, 1996 Referring Provider (PT): Eunice Blase, MD   Encounter Date: 04/12/2020  PT End of Session - 04/12/20 0957    Visit Number  3    Number of Visits  12    Date for PT Re-Evaluation  05/15/20    PT Start Time  0845    PT Stop Time  0935   last 10 min on ice   PT Time Calculation (min)  50 min    Activity Tolerance  Patient tolerated treatment well    Behavior During Therapy  [facility] for tasks assessed/performed       Past Medical History:  Diagnosis Date  . Anxiety   . Diabetes mellitus without complication ([place])   . PCOS (polycystic ovarian syndrome)     History reviewed. No pertinent surgical history.  There were no vitals filed for this visit.  Subjective Assessment - 04/12/20 0912    Subjective  back and neck feel a little better but still can get up to 7/10 pain, not that bad today, her back feels a little better with flexion    Limitations  Walking;Standing    How long can you stand comfortably?  10-15 min    How long can you walk comfortably?  "it depends" - reports factors that impact are where she's at or what she's doing    Diagnostic tests  xrays: mild DDD at L5-S1    Patient Stated Goals  improve pain, walk without pain    Pain Onset  More than a month ago    Pain Onset  More than a month ago                        [facility] Adult PT Treatment/Exercise - 04/12/20 0001      Neck Exercises: Theraband   Shoulder Extension  20 reps;Green    Rows  20 reps;Green    Shoulder External Rotation  20 reps;Green    Shoulder External Rotation Limitations  bilat at same time    Horizontal ABduction  20 reps;Green      Neck Exercises: Standing   Neck Retraction  20 reps      Lumbar Exercises: Stretches   Other Lumbar  Stretch Exercise  seated lumbar flexion stretch pball roll outs 10 sec X 10      Lumbar Exercises: Aerobic   Nustep  L5 6 min UE/LE      Lumbar Exercises: Supine   Pelvic Tilt  10 reps;5 seconds    Bridge  20 reps;5 reps      Lumbar Exercises: Sidelying   Other Sidelying Lumbar Exercises  sidelying lumbar-thoracic rotation 15 reps bilat      Modalities   Modalities  Cryotherapy      Cryotherapy   Number Minutes Cryotherapy  10 Minutes    Cryotherapy Location  Lumbar Spine    Type of Cryotherapy  Ice pack      Manual Therapy   Manual therapy comments  LAD mobs to lumbar/hip both in ER and IR      Neck Exercises: Stretches   Upper Trapezius Stretch  Right;Left;2 reps;30 seconds    Other Neck Stretches  mid doorway stretch 3x30 ec               PT Short Term  Goals - 04/10/20 0832      PT SHORT TERM GOAL #1   Title  perform formal neck assessment with goals to be written    Status  Achieved    Target Date  04/17/20        PT Long Term Goals - 04/10/20 0854      PT LONG TERM GOAL #1   Title  independent with HEP    Status  On-going    Target Date  05/15/20      PT LONG TERM GOAL #2   Title  report pain < 5/10 with standing and walking > 30 min for improved mobility and job responsibilities    Status  On-going    Target Date  05/15/20      PT LONG TERM GOAL #3   Title  demonstrate improved postural awareness for improved tolerance with work activities    Status  On-going    Target Date  05/15/20      PT LONG TERM GOAL #4   Title  report neck pain < 4/10 for improved function    Status  New    Target Date  05/15/20      PT LONG TERM GOAL #5   Title  improve Lt cervical rotation to at least 65 deg for improved function    Status  New    Target Date  05/15/20            Plan - 04/12/20 0957    Clinical Impression Statement  Session focused on lumbar/cervical stretching and strength progression with good tolerance. Some pain with lying supine  and seemed to benefit from lumbar flexion stetching. Trialed ice to lumbar post treatment to reduce soreness and overall pain.    Personal Factors and Comorbidities  Comorbidity 2    Comorbidities  anxiety, DM    Examination-Activity Limitations  Stand;Sleep;Sit;Stairs    Examination-Participation Restrictions  Community Activity;Other   occupation   Stability/Clinical Decision Making  Stable/Uncomplicated    Rehab Potential  Good    PT Frequency  2x / week    PT Duration  6 weeks    PT Treatment/Interventions  ADLs/Self Care Home Management;Cryotherapy;Ultrasound;Traction;Moist Heat;Electrical Stimulation;Gait training;Stair training;Functional mobility training;Therapeutic activities;Patient/family education;Neuromuscular re-education;Therapeutic exercise;Manual techniques;Dry needling;Taping    PT Next Visit Plan  progress postural/back strengthening as able    PT Home Exercise Plan  Access Code: [email or web address]    Consulted and Agree with Plan of Care  Patient       Patient will benefit from skilled therapeutic intervention in order to improve the following deficits and impairments:  Pain, Postural dysfunction, Improper body mechanics, Impaired flexibility, Decreased endurance, Decreased activity tolerance  Visit Diagnosis: Radiculopathy, lumbosacral region  Abnormal posture  Cervicalgia     Problem List There are no problems to display for this patient.   [name] 04/12/2020, 10:01 AM  [facility] Physical Therapy [address] [place], [place], [address] Phone: [phone]   Fax:  [phone]  Name: Julia Michael MRN: [id number] Date of Birth: [birth year]

## 2020-04-17 ENCOUNTER — Encounter: Payer: No Typology Code available for payment source | Admitting: Physical Therapy

## 2020-04-17 ENCOUNTER — Telehealth: Payer: Self-pay | Admitting: Physical Therapy

## 2020-04-17 NOTE — Telephone Encounter (Signed)
Pt no show for PT appointment today. They were contacted and informed of this and she states she thought her appointment was on the 04/18/20 instead of 04/17/20. They were provided the date and time of their next appointment and she confirms she will be there. They were instructed to call us to let us know if they cannot make their appointment.  Ivery Quale, PT, DPT 04/17/20 8:27 AM

## 2020-04-19 ENCOUNTER — Other Ambulatory Visit: Payer: Self-pay

## 2020-04-19 ENCOUNTER — Ambulatory Visit (INDEPENDENT_AMBULATORY_CARE_PROVIDER_SITE_OTHER): Payer: No Typology Code available for payment source | Admitting: Physical Therapy

## 2020-04-19 DIAGNOSIS — R293 Abnormal posture: Secondary | ICD-10-CM

## 2020-04-19 DIAGNOSIS — M542 Cervicalgia: Secondary | ICD-10-CM | POA: Diagnosis not present

## 2020-04-19 DIAGNOSIS — M5417 Radiculopathy, lumbosacral region: Secondary | ICD-10-CM | POA: Diagnosis not present

## 2020-04-19 NOTE — Therapy (Signed)
[place], [place], [address] Phone: [phone]   Fax:  [phone]  Physical Therapy Treatment  Patient Details  Name: Julia Michael MRN: [id number] Date of Birth: 1995/12/13 Referring Provider (PT): Eunice Blase, MD   Encounter Date: 04/19/2020  PT End of Session - 04/19/20 0808    Visit Number  4    Number of Visits  12    Date for PT Re-Evaluation  05/15/20    PT Start Time  0800    PT Stop Time  0845    PT Time Calculation (min)  45 min    Activity Tolerance  Patient tolerated treatment well    Behavior During Therapy  [facility] for tasks assessed/performed       Past Medical History:  Diagnosis Date  . Anxiety   . Diabetes mellitus without complication ([place])   . PCOS (polycystic ovarian syndrome)     No past surgical history on file.  There were no vitals filed for this visit.  Subjective Assessment - 04/19/20 0808    Subjective  4/10 back pain today, no neck pain reported    Limitations  Walking;Standing    How long can you stand comfortably?  10-15 min    How long can you walk comfortably?  "it depends" - reports factors that impact are where she's at or what she's doing    Diagnostic tests  xrays: mild DDD at L5-S1    Patient Stated Goals  improve pain, walk without pain    Pain Onset  More than a month ago    Pain Onset  More than a month ago         [facility] PT Assessment - 04/19/20 0001      Assessment   Medical Diagnosis  M54.42,M54.41 (ICD-10-CM) - Acute midline low back pain with bilateral sciatica, neck pain    Referring Provider (PT)  Hilts, Michael, MD      AROM   Cervical - Right Rotation  70    Cervical - Left Rotation  70      Strength   Overall Strength Comments  UE strength WNL, fair core strength        [facility] Adult PT Treatment/Exercise - 04/19/20 0001      Neck Exercises: Theraband   Shoulder Extension  20 reps;Green    Shoulder Extension Limitations  sitting on pball     Rows  20 reps;Green    Rows Limitations  sitting on pball    Shoulder External Rotation  20 reps;Green    Shoulder External Rotation Limitations  bilat at same time, sitting on pball    Horizontal ABduction  20 reps;Green    Horizontal ABduction Limitations  sitting on pball      Lumbar Exercises: Stretches   Single Knee to Chest Stretch  Right;Left;2 reps;30 seconds    Other Lumbar Stretch Exercise  seated lumbar flexion stretch pball roll outs 10 sec X 10      Lumbar Exercises: Aerobic   Nustep  L6 6 min UE/LE      Lumbar Exercises: Machines for Strengthening   Leg Press  75 lbs X 20      Lumbar Exercises: Seated   Other Seated Lumbar Exercises  seated on pball for following:  posterior pelvic rocks X 20 rops, D2 lifts 2 lb ball X 10 bilat, seated pball LAQ X 10 reps bilat, seated marches X 10 bilat      Lumbar Exercises: Supine   Bridge  20 reps;5 reps      Lumbar Exercises: Sidelying   Other Sidelying Lumbar Exercises  sidelying lumbar-thoracic rotation 10 reps bilat      Cryotherapy   Number Minutes Cryotherapy  7 Minutes    Cryotherapy Location  Lumbar Spine    Type of Cryotherapy  Ice pack               PT Short Term Goals - 04/10/20 0832      PT SHORT TERM GOAL #1   Title  perform formal neck assessment with goals to be written    Status  Achieved    Target Date  04/17/20        PT Long Term Goals - 04/19/20 0828      PT LONG TERM GOAL #1   Title  independent with HEP    Status  On-going      PT LONG TERM GOAL #2   Title  report pain < 5/10 with standing and walking > 30 min for improved mobility and job responsibilities    Baseline  4/10 today but can still get up to 7    Status  On-going      PT LONG TERM GOAL #3   Title  demonstrate improved postural awareness for improved tolerance with work activities    Baseline  PT continuing to work on this    Status  On-going      PT LONG TERM GOAL #4   Title  report neck pain < 4/10 for improved  function    Baseline  no neck pain last 2 sessions    Status  Achieved      PT LONG TERM GOAL #5   Title  improve Lt cervical rotation to at least 65 deg for improved function    Baseline  70 deg today    Status  Achieved            Plan - 04/19/20 0827    Clinical Impression Statement  Overall less pain today so gradually progressed her core strength today with good tolerance and without complaints. Overall her neck is doing much better but she will continue to still benefit from PT for lumbar.    Personal Factors and Comorbidities  Comorbidity 2    Comorbidities  anxiety, DM    Examination-Activity Limitations  Stand;Sleep;Sit;Stairs    Examination-Participation Restrictions  Community Activity;Other   occupation   Stability/Clinical Decision Making  Stable/Uncomplicated    Rehab Potential  Good    PT Frequency  2x / week    PT Duration  6 weeks    PT Treatment/Interventions  ADLs/Self Care Home Management;Cryotherapy;Ultrasound;Traction;Moist Heat;Electrical Stimulation;Gait training;Stair training;Functional mobility training;Therapeutic activities;Patient/family education;Neuromuscular re-education;Therapeutic exercise;Manual techniques;Dry needling;Taping    PT Next Visit Plan  progress postural/back strengthening as able    PT Home Exercise Plan  Access Code: [email or web address]    Consulted and Agree with Plan of Care  Patient       Patient will benefit from skilled therapeutic intervention in order to improve the following deficits and impairments:  Pain, Postural dysfunction, Improper body mechanics, Impaired flexibility, Decreased endurance, Decreased activity tolerance  Visit Diagnosis: Radiculopathy, lumbosacral region  Abnormal posture  Cervicalgia     Problem List There are no problems to display for this patient.   [name] 04/19/2020, 8:36 AM  [facility] Physical Therapy [address] [place], [place], [address] Phone:  [phone]   Fax:  [phone]  Name: Julia Michael MRN: [id number] Date of  Birth: 08/14/1996

## 2020-04-24 ENCOUNTER — Ambulatory Visit (INDEPENDENT_AMBULATORY_CARE_PROVIDER_SITE_OTHER): Payer: No Typology Code available for payment source | Admitting: Physical Therapy

## 2020-04-24 ENCOUNTER — Encounter: Payer: Self-pay | Admitting: Physical Therapy

## 2020-04-24 ENCOUNTER — Other Ambulatory Visit: Payer: Self-pay

## 2020-04-24 DIAGNOSIS — M5417 Radiculopathy, lumbosacral region: Secondary | ICD-10-CM | POA: Diagnosis not present

## 2020-04-24 DIAGNOSIS — R293 Abnormal posture: Secondary | ICD-10-CM

## 2020-04-24 DIAGNOSIS — M542 Cervicalgia: Secondary | ICD-10-CM | POA: Diagnosis not present

## 2020-04-24 NOTE — Patient Instructions (Signed)
Access Code: [email or web address] URL: [email or web address] Date: 04/24/2020 Prepared by: Moshe Cipro  Exercises Seated Hamstring Stretch - 2 x daily - 7 x weekly - 3 reps - 1 sets - 30 sec hold Seated Single Knee to Chest - 2 x daily - 7 x weekly - 1 sets - 3 reps - 30 sec hold Seated Pelvic Tilt - 2 x daily - 7 x weekly - 1 sets - 10 reps - 5 sec hold Seated Piriformis Stretch with Trunk Bend - 2 x daily - 7 x weekly - 1 sets - 3 reps - 30 sec hold Hooklying Single Knee to Chest - 2 x daily - 7 x weekly - 3 reps - 1 sets - 30 sec hold Supine Posterior Pelvic Tilt - 2 x daily - 7 x weekly - 10 reps - 1 sets - 5 sec hold Supine Piriformis Stretch with Foot on Ground - 2 x daily - 7 x weekly - 3 reps - 1 sets - 30 sec hold Supine Chin Tuck - 2 x daily - 7 x weekly - 10 reps - 1 sets - 5 sec hold Standing Backward Shoulder Rolls - 2 x daily - 7 x weekly - 10 reps - 1 sets Seated Scapular Retraction - 2 x daily - 7 x weekly - 10 reps - 1 sets - 5 sec hold Doorway Pec Stretch at 60 Elevation - 2 x daily - 7 x weekly - 3 reps - 1 sets - 30 sec hold  Patient Education Trigger Point Dry Needling

## 2020-04-24 NOTE — Therapy (Signed)
[place], [place], [address] Phone: [phone]   Fax:  [phone]  Physical Therapy Treatment  Patient Details  Name: Julia Michael MRN: [id number] Date of Birth: 07-12-1996 Referring Provider (PT): Eunice Blase, MD   Encounter Date: 04/24/2020  PT End of Session - 04/24/20 0840    Visit Number  5    Number of Visits  12    Date for PT Re-Evaluation  05/15/20    PT Start Time  0802    PT Stop Time  0840    PT Time Calculation (min)  38 min    Activity Tolerance  Patient tolerated treatment well    Behavior During Therapy  [facility] for tasks assessed/performed       Past Medical History:  Diagnosis Date  . Anxiety   . Diabetes mellitus without complication ([place])   . PCOS (polycystic ovarian syndrome)     History reviewed. No pertinent surgical history.  There were no vitals filed for this visit.  Subjective Assessment - 04/24/20 0807    Subjective  not doing well today - neck is very stiff, reports occasional spasms and describes as "jerking."  interested in a neck brace.    Limitations  Walking;Standing    How long can you stand comfortably?  10-15 min    How long can you walk comfortably?  "it depends" - reports factors that impact are where she's at or what she's doing    Diagnostic tests  xrays: mild DDD at L5-S1    Patient Stated Goals  improve pain, walk without pain    Currently in Pain?  Yes    Pain Score  4     Pain Location  Back    Pain Orientation  Lower    Pain Descriptors / Indicators  Aching;Shooting    Pain Type  Chronic pain    Pain Onset  More than a month ago    Pain Frequency  Constant    Aggravating Factors   lying suping, standing, walking    Pain Relieving Factors  ice    Pain Score  6    Pain Location  Neck    Pain Orientation  Upper;Posterior    Pain Descriptors / Indicators  Aching;Other (Comment);Pins and needles   stiffness   Pain Type  Chronic pain    Pain Onset  More  than a month ago    Aggravating Factors   work, "when I don't crack my neck"    Pain Relieving Factors  popping it                        [facility] Adult PT Treatment/Exercise - 04/24/20 0810      Lumbar Exercises: Aerobic   Nustep  L5 6 min UE/LE      Manual Therapy   Manual therapy comments  STM to suboccipitals and cervical paraspinals bil; skilled palpation and monitoring of soft tissue during DN      Neck Exercises: Stretches   Upper Trapezius Stretch  Right;Left;3 reps;20 seconds    Levator Stretch  Right;Left;3 reps;20 seconds    Other Neck Stretches  towel rotation stretch 5x10 sec hold bil       Trigger Point Dry Needling - 04/24/20 0828    Consent Given?  Yes    Education Handout Provided  Yes    Muscles Treated Head and Neck  Suboccipitals;Semispinalis capitus;Splenius capitus    Suboccipitals Response  Twitch response elicited;Palpable increased  muscle length    Splenius capitus Response  Twitch reponse elicited;Palpable increased muscle length    Semispinalis capitus Response  Twitch reponse elicited;Palpable increased muscle length           PT Education - 04/24/20 0832    Education Details  DN    Person(s) Educated  Patient    Methods  Explanation    Comprehension  Verbalized understanding       PT Short Term Goals - 04/10/20 0832      PT SHORT TERM GOAL #1   Title  perform formal neck assessment with goals to be written    Status  Achieved    Target Date  04/17/20        PT Long Term Goals - 04/19/20 0828      PT LONG TERM GOAL #1   Title  independent with HEP    Status  On-going      PT LONG TERM GOAL #2   Title  report pain < 5/10 with standing and walking > 30 min for improved mobility and job responsibilities    Baseline  4/10 today but can still get up to 7    Status  On-going      PT LONG TERM GOAL #3   Title  demonstrate improved postural awareness for improved tolerance with work activities    Baseline  PT  continuing to work on this    Status  On-going      PT LONG TERM GOAL #4   Title  report neck pain < 4/10 for improved function    Baseline  no neck pain last 2 sessions    Status  Achieved      PT LONG TERM GOAL #5   Title  improve Lt cervical rotation to at least 65 deg for improved function    Baseline  70 deg today    Status  Achieved            Plan - 04/24/20 0840    Clinical Impression Statement  Pt reported decreased pain in neck following DN and manual therapy today.  Overall symptoms seem to fluctuate at this time.  Will continue to benefit from PT to maximize function.    Personal Factors and Comorbidities  Comorbidity 2    Comorbidities  anxiety, DM    Examination-Activity Limitations  Stand;Sleep;Sit;Stairs    Examination-Participation Restrictions  Community Activity;Other   occupation   Stability/Clinical Decision Making  Stable/Uncomplicated    Rehab Potential  Good    PT Frequency  2x / week    PT Duration  6 weeks    PT Treatment/Interventions  ADLs/Self Care Home Management;Cryotherapy;Ultrasound;Traction;Moist Heat;Electrical Stimulation;Gait training;Stair training;Functional mobility training;Therapeutic activities;Patient/family education;Neuromuscular re-education;Therapeutic exercise;Manual techniques;Dry needling;Taping    PT Next Visit Plan  progress postural/back strengthening as able, assess response to DN    PT Home Exercise Plan  Access Code: [email or web address]    Consulted and Agree with Plan of Care  Patient       Patient will benefit from skilled therapeutic intervention in order to improve the following deficits and impairments:  Pain, Postural dysfunction, Improper body mechanics, Impaired flexibility, Decreased endurance, Decreased activity tolerance  Visit Diagnosis: Radiculopathy, lumbosacral region  Abnormal posture  Cervicalgia     Problem List There are no problems to display for this patient.     Clarita Crane, PT,  DPT 04/24/20 8:42 AM    [facility] Physical Therapy [address] [place], [place], [address] Phone: [phone]  Fax:  [phone]  Name: Julia Michael MRN: [id number] Date of Birth: Jun 11, 1996

## 2020-04-26 ENCOUNTER — Encounter: Payer: No Typology Code available for payment source | Admitting: Physical Therapy

## 2020-05-01 ENCOUNTER — Encounter: Payer: No Typology Code available for payment source | Admitting: Physical Therapy

## 2020-05-04 ENCOUNTER — Ambulatory Visit (INDEPENDENT_AMBULATORY_CARE_PROVIDER_SITE_OTHER): Payer: No Typology Code available for payment source | Admitting: Physical Therapy

## 2020-05-04 ENCOUNTER — Other Ambulatory Visit: Payer: Self-pay

## 2020-05-04 DIAGNOSIS — R293 Abnormal posture: Secondary | ICD-10-CM | POA: Diagnosis not present

## 2020-05-04 DIAGNOSIS — M542 Cervicalgia: Secondary | ICD-10-CM

## 2020-05-04 DIAGNOSIS — M5417 Radiculopathy, lumbosacral region: Secondary | ICD-10-CM | POA: Diagnosis not present

## 2020-05-04 NOTE — Therapy (Deleted)
[place], [place], [address] Phone: [phone]   Fax:  [phone]  Physical Therapy Treatment  Patient Details  Name: Julia Michael MRN: [id number] Date of Birth: 06-10-1996 Referring Provider (PT): Eunice Blase, MD   Encounter Date: 05/04/2020   PT End of Session - 05/04/20 0841    Visit Number 6    Number of Visits 12    Date for PT Re-Evaluation 05/15/20    PT Start Time 0806    PT Stop Time 0845    PT Time Calculation (min) 39 min    Activity Tolerance Patient tolerated treatment well    Behavior During Therapy [facility] for tasks assessed/performed           Past Medical History:  Diagnosis Date  . Anxiety   . Diabetes mellitus without complication ([place])   . PCOS (polycystic ovarian syndrome)     No past surgical history on file.  There were no vitals filed for this visit.   Subjective Assessment - 05/04/20 0858    Subjective relays mild back pain, moderate neck pain, the dry needling really helped last time and I want to get that again    Limitations Walking;Standing    How long can you stand comfortably? 10-15 min    How long can you walk comfortably? "it depends" - reports factors that impact are where she's at or what she's doing    Diagnostic tests xrays: mild DDD at L5-S1    Patient Stated Goals improve pain, walk without pain    Pain Onset More than a month ago    Pain Onset More than a month ago              [facility] PT Assessment - 05/04/20 0001      Assessment   Medical Diagnosis M54.42,M54.41 (ICD-10-CM) - Acute midline low back pain with bilateral sciatica, neck pain    Referring Provider (PT) Hilts, Michael, MD      AROM   Cervical - Right Rotation 80    Cervical - Left Rotation 75                         [facility] Adult PT Treatment/Exercise - 05/04/20 0001      Neck Exercises: Theraband   Shoulder Extension 20 reps;Green    Rows 20 reps;Green    Shoulder  External Rotation 20 reps;Green      Lumbar Exercises: Stretches   Single Knee to Chest Stretch Right;Left;2 reps;30 seconds    Lower Trunk Rotation 5 reps;10 seconds    Other Lumbar Stretch Exercise Neck stretch child pose 30 sec X 3 Upper trap 30 sec X 2     Lumbar Exercises: Supine   Bridge 20 reps;5 reps      Manual Therapy   Manual therapy comments STM to suboccipitals and cervical paraspinals bil; skilled palpation and monitoring of soft tissue during DN            Trigger Point Dry Needling - 05/04/20 0844    Consent Given? Yes    Education Handout Provided Previously provided    Muscles Treated Head and Neck Suboccipitals;Semispinalis capitus;Splenius capitus    Suboccipitals Response Twitch response elicited;Palpable increased muscle length    Splenius capitus Response Twitch reponse elicited;Palpable increased muscle length    Semispinalis capitus Response Twitch reponse elicited;Palpable increased muscle length  PT Short Term Goals - 04/10/20 0832      PT SHORT TERM GOAL #1   Title perform formal neck assessment with goals to be written    Status Achieved    Target Date 04/17/20             PT Long Term Goals - 04/19/20 0828      PT LONG TERM GOAL #1   Title independent with HEP    Status On-going      PT LONG TERM GOAL #2   Title report pain < 5/10 with standing and walking > 30 min for improved mobility and job responsibilities    Baseline 4/10 today but can still get up to 7    Status On-going      PT LONG TERM GOAL #3   Title demonstrate improved postural awareness for improved tolerance with work activities    Baseline PT continuing to work on this    Status On-going      PT LONG TERM GOAL #4   Title report neck pain < 4/10 for improved function    Baseline no neck pain last 2 sessions    Status Achieved      PT LONG TERM GOAL #5   Title improve Lt cervical rotation to at least 65 deg for improved function     Baseline 70 deg today    Status Achieved                 Plan - 05/04/20 0859    Clinical Impression Statement She is overall progressing well with PT and responding very well to DN (Performed by Moshe Cipro, PT,DPT). Then session focused on neck/lumbar stretching and strengthening with improved activity tolerance after DN. Her cervical ROM improved today with updated measurments. Continue POC and she was encouraged to set up more appointments.    Personal Factors and Comorbidities Comorbidity 2    Comorbidities anxiety, DM    Examination-Activity Limitations Stand;Sleep;Sit;Stairs    Examination-Participation Restrictions Community Activity;Other   occupation   Stability/Clinical Decision Making Stable/Uncomplicated    Rehab Potential Good    PT Frequency 2x / week    PT Duration 6 weeks    PT Treatment/Interventions ADLs/Self Care Home Management;Cryotherapy;Ultrasound;Traction;Moist Heat;Electrical Stimulation;Gait training;Stair training;Functional mobility training;Therapeutic activities;Patient/family education;Neuromuscular re-education;Therapeutic exercise;Manual techniques;Dry needling;Taping    PT Next Visit Plan progress postural/back strengthening as able, assess response to DN    PT Home Exercise Plan Access Code: [email or web address]    Consulted and Agree with Plan of Care Patient           Patient will benefit from skilled therapeutic intervention in order to improve the following deficits and impairments:  Pain, Postural dysfunction, Improper body mechanics, Impaired flexibility, Decreased endurance, Decreased activity tolerance  Visit Diagnosis: Radiculopathy, lumbosacral region  Abnormal posture  Cervicalgia     Problem List There are no problems to display for this patient.   April Manson, PT,DPT 05/04/2020, 9:03 AM  [facility] Physical Therapy [address] [place], [place], [address] Phone: [phone]   Fax:   [phone]  Name: Julia Michael MRN: [id number] Date of Birth: Jun 06, 1996

## 2020-05-04 NOTE — Therapy (Signed)
[facility] Physical Therapy [address] [place], [place], [address] Phone: [phone]   Fax:  [phone]  Physical Therapy Treatment  Patient Details  Name: Julia Michael MRN: [id number] Date of Birth: 10-30-1996 Referring Provider (PT): Lavada Mesi, MD   Encounter Date: 05/04/2020   PT End of Session - 05/04/20 0841    Visit Number 6    Number of Visits 12    Date for PT Re-Evaluation 05/15/20    PT Start Time 0806    PT Stop Time 0845    PT Time Calculation (min) 39 min    Activity Tolerance Patient tolerated treatment well    Behavior During Therapy [facility] for tasks assessed/performed           Past Medical History:  Diagnosis Date  . Anxiety   . Diabetes mellitus without complication (HCC)   . PCOS (polycystic ovarian syndrome)     No past surgical history on file.  There were no vitals filed for this visit.   Subjective Assessment - 05/04/20 0858    Subjective relays mild back pain, moderate neck pain, the dry needling really helped last time and I want to get that again    Limitations Walking;Standing    How long can you stand comfortably? 10-15 min    How long can you walk comfortably? "it depends" - reports factors that impact are where she's at or what she's doing    Diagnostic tests xrays: mild DDD at L5-S1    Patient Stated Goals improve pain, walk without pain    Pain Onset More than a month ago    Pain Onset More than a month ago              [facility] PT Assessment - 05/04/20 0001      Assessment   Medical Diagnosis M54.42,M54.41 (ICD-10-CM) - Acute midline low back pain with bilateral sciatica, neck pain    Referring Provider (PT) Hilts, Michael, MD      AROM   Cervical - Right Rotation 80    Cervical - Left Rotation 75                         [facility] Adult PT Treatment/Exercise - 05/04/20 0001      Neck Exercises: Theraband   Shoulder Extension 20 reps;Green    Rows 20 reps;Green    Shoulder  External Rotation 20 reps;Green      Lumbar Exercises: Stretches   Single Knee to Chest Stretch Right;Left;2 reps;30 seconds    Lower Trunk Rotation 5 reps;10 seconds    Other Lumbar Stretch Exercise child pose 30 sec X 3      Lumbar Exercises: Machines for Strengthening   Other Lumbar Machine Exercise UBE warm up 6 min UE/LE together L3      Lumbar Exercises: Supine   Bridge 20 reps;5 reps      Manual Therapy   Manual therapy comments STM to suboccipitals and cervical paraspinals bil; skilled palpation and monitoring of soft tissue during DN            Trigger Point Dry Needling - 05/04/20 0844    Consent Given? Yes    Education Handout Provided Previously provided    Muscles Treated Head and Neck Suboccipitals;Semispinalis capitus;Splenius capitus    Suboccipitals Response Twitch response elicited;Palpable increased muscle length    Splenius capitus Response Twitch reponse elicited;Palpable increased muscle length    Semispinalis capitus Response Twitch reponse elicited;Palpable increased muscle length  PT Short Term Goals - 04/10/20 0832      PT SHORT TERM GOAL #1   Title perform formal neck assessment with goals to be written    Status Achieved    Target Date 04/17/20             PT Long Term Goals - 04/19/20 0828      PT LONG TERM GOAL #1   Title independent with HEP    Status On-going      PT LONG TERM GOAL #2   Title report pain < 5/10 with standing and walking > 30 min for improved mobility and job responsibilities    Baseline 4/10 today but can still get up to 7    Status On-going      PT LONG TERM GOAL #3   Title demonstrate improved postural awareness for improved tolerance with work activities    Baseline PT continuing to work on this    Status On-going      PT [place] #4   Title report neck pain < 4/10 for improved function    Baseline no neck pain last 2 sessions    Status Achieved      PT LONG TERM GOAL #5    Title improve Lt cervical rotation to at least 65 deg for improved function    Baseline 70 deg today    Status Achieved                 Plan - 05/04/20 0859    Clinical Impression Statement She is overall progressing well with PT and responding very well to DN (Performed by Faustino Congress, PT,DPT). Then session focused on neck/lumbar stretching and strengthening with improved activity tolerance after DN. Her cervical ROM improved today with updated measurments. Continue POC and she was encouraged to set up more appointments.    Personal Factors and Comorbidities Comorbidity 2    Comorbidities anxiety, DM    Examination-Activity Limitations Stand;Sleep;Sit;Stairs    Examination-Participation Restrictions Community Activity;Other   occupation   Stability/Clinical Decision Making Stable/Uncomplicated    Rehab Potential Good    PT Frequency 2x / week    PT Duration 6 weeks    PT Treatment/Interventions ADLs/Self Care Home Management;Cryotherapy;Ultrasound;Traction;Moist Heat;Electrical Stimulation;Gait training;Stair training;Functional mobility training;Therapeutic activities;Patient/family education;Neuromuscular re-education;Therapeutic exercise;Manual techniques;Dry needling;Taping    PT Next Visit Plan progress postural/back strengthening as able, assess response to DN    PT Home Exercise Plan Access Code: [email or web address]    Consulted and Agree with Plan of Care Patient           Patient will benefit from skilled therapeutic intervention in order to improve the following deficits and impairments:  Pain, Postural dysfunction, Improper body mechanics, Impaired flexibility, Decreased endurance, Decreased activity tolerance  Visit Diagnosis: Radiculopathy, lumbosacral region  Abnormal posture  Cervicalgia     Problem List There are no problems to display for this patient.   Debbe Odea , PT,DPT 05/04/2020, 9:07 AM  [facility] Physical Therapy [address] [place], [place], [address] Phone: [phone]   Fax:  [phone]  Name: Julia Michael MRN: [id number] Date of Birth: 12-Oct-1996

## 2020-05-18 ENCOUNTER — Encounter: Payer: No Typology Code available for payment source | Admitting: Physical Therapy

## 2020-05-23 ENCOUNTER — Encounter: Payer: No Typology Code available for payment source | Admitting: Physical Therapy

## 2020-06-28 ENCOUNTER — Other Ambulatory Visit: Payer: Self-pay

## 2020-06-28 ENCOUNTER — Ambulatory Visit (INDEPENDENT_AMBULATORY_CARE_PROVIDER_SITE_OTHER): Payer: No Typology Code available for payment source | Admitting: Physical Therapy

## 2020-06-28 DIAGNOSIS — M5417 Radiculopathy, lumbosacral region: Secondary | ICD-10-CM

## 2020-06-28 DIAGNOSIS — R293 Abnormal posture: Secondary | ICD-10-CM

## 2020-06-28 DIAGNOSIS — M542 Cervicalgia: Secondary | ICD-10-CM

## 2020-06-28 NOTE — Therapy (Signed)
[facility] Physical Therapy [address] [place], [place], [address] Phone: [phone]   Fax:  [phone]  Physical Therapy Treatment  Patient Details  Name: Julia Michael MRN: [id number] Date of Birth: 29-Mar-1996 Referring Provider (PT): Lavada Mesi, MD   Encounter Date: 06/28/2020   PT End of Session - 06/28/20 1416    Visit Number 7    Number of Visits 15    Date for PT Re-Evaluation 08/09/20   recert on 06/28/20   PT Start Time 1345    PT Stop Time 1425    PT Time Calculation (min) 40 min    Activity Tolerance Patient tolerated treatment well    Behavior During Therapy [facility] for tasks assessed/performed           Past Medical History:  Diagnosis Date  . Anxiety   . Diabetes mellitus without complication (HCC)   . PCOS (polycystic ovarian syndrome)     No past surgical history on file.  There were no vitals filed for this visit.   Subjective Assessment - 06/28/20 1412    Subjective She reports she wants to start PT again after 2 month lay off due to vacation. She relays her neck is not bothering her she just cant turn it as much to her Rt as she can to her left. Her biggest complaint today is low back pain. She says it has been severe lately in the center of her low back/sacrum bothered by too much standing and walking. She denies any radiculopathy today. She states she has been exercising more and has lost 10 lbs but she is sore all over.    Limitations Walking;Standing    How long can you stand comfortably? 10-15 min    How long can you walk comfortably? "it depends" - reports factors that impact are where she's at or what she's doing    Diagnostic tests xrays: mild DDD at L5-S1    Patient Stated Goals improve pain, walk without pain    Pain Onset More than a month ago    Pain Onset More than a month ago              [facility] PT Assessment - 06/28/20 0001      AROM   Cervical Flexion WNL    Cervical Extension WNL    Cervical -  Right Side Bend WNL    Cervical - Left Side Bend WNL    Cervical - Right Rotation 70    Cervical - Left Rotation 60    Lumbar Flexion WNL - directional preference    Lumbar Extension WNl - no change with repetition    Lumbar - Right Side Bend WNL    Lumbar - Left Side Bend WNL    Lumbar - Right Rotation WNL    Lumbar - Left Rotation WNL      Strength   Overall Strength Within functional limits for tasks performed    Overall Strength Comments UE strength overall 5/5. LE strength overall 5/5 except Lt hip flexion 4/5                         [facility] Adult PT Treatment/Exercise - 06/28/20 0001      Neck Exercises: Theraband   Rows 20 reps;Green    Rows Limitations 2 sets      Lumbar Exercises: Stretches   Passive Hamstring Stretch Right;Left;3 reps;30 seconds    Double Knee to Chest Stretch Limitations with feet on pball 10  reps X 10 sec      Lumbar Exercises: Aerobic   Recumbent Bike L2 X 5 min      Lumbar Exercises: Supine   Bridge 20 reps;5 reps    Straight Leg Raises Limitations 2 sets of 10 bilat with 2 lbs      Lumbar Exercises: Quadruped   Opposite Arm/Leg Raise 10 reps    Opposite Arm/Leg Raise Limitations 3 sec hold    Other Quadruped Lumbar Exercises child pose stretch 30 sec X 3             PT Short Term Goals - 04/10/20 7494      PT SHORT TERM GOAL #1   Title perform formal neck assessment with goals to be written    Status Achieved    Target Date 04/17/20             PT Long Term Goals - 06/28/20 1427      PT LONG TERM GOAL #1   Title independent with HEP    Status On-going      PT LONG TERM GOAL #2   Title report pain < 5/10 with standing and walking > 30 min for improved mobility and job responsibilities    Baseline 4/10 today but can still get up to 7    Status On-going      PT LONG TERM GOAL #3   Title demonstrate improved postural awareness for improved tolerance with work activities    Baseline PT continuing to work on  this    Status On-going      PT LONG TERM GOAL #4   Title report neck pain < 4/10 for improved function    Baseline no neck pain last 2 sessions    Status Achieved      PT LONG TERM GOAL #5   Title improve Lt cervical rotation to at least 65 deg for improved function    Status Achieved                 Plan - 06/28/20 1421    Clinical Impression Statement She has not attended PT in 2 months due to being on vacation. Recert performed today to extend her POC date. Overall lumbar ROM doing well, neck ROM doing well except rotation. Session focused on core strengthening and lumbar stretching to reduce overall pain. She has direction preference for flexion at this time.    Personal Factors and Comorbidities Comorbidity 2    Comorbidities anxiety, DM    Examination-Activity Limitations Stand;Sleep;Sit;Stairs    Examination-Participation Restrictions Community Activity;Other   occupation   Stability/Clinical Decision Making Stable/Uncomplicated    Rehab Potential Good    PT Frequency 2x / week    PT Duration 6 weeks    PT Treatment/Interventions ADLs/Self Care Home Management;Cryotherapy;Ultrasound;Traction;Moist Heat;Electrical Stimulation;Gait training;Stair training;Functional mobility training;Therapeutic activities;Patient/family education;Neuromuscular re-education;Therapeutic exercise;Manual techniques;Dry needling;Taping    PT Next Visit Plan progress postural/back strengthening as able, back was worse today, DN as able.    PT Home Exercise Plan Access Code: [email or web address]    Consulted and Agree with Plan of Care Patient           Patient will benefit from skilled therapeutic intervention in order to improve the following deficits and impairments:  Pain, Postural dysfunction, Improper body mechanics, Impaired flexibility, Decreased endurance, Decreased activity tolerance  Visit Diagnosis: Radiculopathy, lumbosacral region  Abnormal posture  Cervicalgia     Problem  List There are no problems to display for this patient.  [name] 06/28/2020, 2:30 PM  [facility] Physical Therapy [address] [place], [place], [address] Phone: [phone]   Fax:  [phone]  Name: Julia Michael MRN: [id number] Date of Birth: 06/28/1996

## 2020-06-28 NOTE — Addendum Note (Signed)
Addended by: April Manson on: 06/28/2020 02:41 PM   Modules accepted: Orders

## 2020-07-05 ENCOUNTER — Encounter: Payer: No Typology Code available for payment source | Admitting: Rehabilitative and Restorative Service Providers"

## 2020-07-12 ENCOUNTER — Other Ambulatory Visit: Payer: Self-pay

## 2020-07-12 ENCOUNTER — Ambulatory Visit (HOSPITAL_COMMUNITY)
Admission: EM | Admit: 2020-07-12 | Discharge: 2020-07-12 | Disposition: A | Discharge status: Home / Self Care | Payer: No Typology Code available for payment source | Attending: Family Medicine | Admitting: Family Medicine

## 2020-07-12 ENCOUNTER — Encounter (HOSPITAL_COMMUNITY): Payer: Self-pay

## 2020-07-12 DIAGNOSIS — Z20822 Contact with and (suspected) exposure to covid-19: Secondary | ICD-10-CM | POA: Diagnosis present

## 2020-07-12 DIAGNOSIS — R0602 Shortness of breath: Secondary | ICD-10-CM | POA: Diagnosis present

## 2020-07-12 NOTE — ED Triage Notes (Signed)
Patient presents to Urgent Care with complaints of sob since 2-3 days ago. Patient reports one of her friends tested positive for covid last week.

## 2020-07-12 NOTE — ED Provider Notes (Signed)
[facility]    CSN: [id number] Arrival date & time: 07/12/20  1718      History   Chief Complaint Chief Complaint  Patient presents with  . Shortness of Breath    HPI Julia Michael is a 24 y.o. female.   HPI Patient has shortness of breath for 2 or 3 days.  No asthma.  No wheezing.  No use of inhalers.  No fever chills.  No headache or body ache.  No change in appetite taste or smell. Close friend tested positive for Covid a couple days ago. Past Medical History:  Diagnosis Date  . Anxiety   . Diabetes mellitus without complication (HCC)   . PCOS (polycystic ovarian syndrome)     There are no problems to display for this patient.   History reviewed. No pertinent surgical history.  OB History   No obstetric history on file.      Home Medications    Prior to Admission medications   Medication Sig Start Date End Date Taking? Authorizing Provider  sertraline (ZOLOFT) 50 MG tablet Take 50 mg by mouth daily.    [provider]    Family History Family History  Problem Relation Age of Onset  . Thyroid disease Mother   . Healthy Father     Social History Social History   Tobacco Use  . Smoking status: Never Smoker  . Smokeless tobacco: Never Used  Substance Use Topics  . Alcohol use: Yes    Comment: socially  . Drug use: No     Allergies   Ibuprofen, Pecan extract allergy skin test, and Pecan nut (diagnostic)   Review of Systems Review of Systems  See HPI Physical Exam Triage Vital Signs ED Triage Vitals  Enc Vitals Group     BP 07/12/20 1812 122/65     Pulse Rate 07/12/20 1812 68     Resp 07/12/20 1812 16     Temp 07/12/20 1812 98.1 F (36.7 C)     Temp Source 07/12/20 1812 Oral     SpO2 07/12/20 1812 100 %     Weight --      Height --      Head Circumference --      Peak Flow --      Pain Score 07/12/20 1823 5     Pain Loc --      Pain Edu? --      Excl. in GC? --    No data found.  Updated Vital  Signs BP 122/65 (BP Location: Left Arm)   Pulse 68   Temp 98.1 F (36.7 C) (Oral)   Resp 16   SpO2 100%      Physical Exam Constitutional:      General: She is not in acute distress.    Appearance: She is well-developed. She is obese. She is not ill-appearing.  HENT:     Head: Normocephalic and atraumatic.     Mouth/Throat:     Comments: Mask is in place Eyes:     Conjunctiva/sclera: Conjunctivae normal.     Pupils: Pupils are equal, round, and reactive to light.  Cardiovascular:     Rate and Rhythm: Normal rate and regular rhythm.     Heart sounds: Normal heart sounds.  Pulmonary:     Effort: Pulmonary effort is normal. No respiratory distress.     Breath sounds: Normal breath sounds.     Comments: Lungs are clear Abdominal:     General: There is no distension.  Palpations: Abdomen is soft.  Musculoskeletal:        General: Normal range of motion.     Cervical back: Normal range of motion.  Skin:    General: Skin is warm and dry.  Neurological:     Mental Status: She is alert.  Psychiatric:        Mood and Affect: Mood normal.        Behavior: Behavior normal.      UC Treatments / Results  Labs (all labs ordered are listed, but only abnormal results are displayed) Labs Reviewed  SARS CORONAVIRUS 2 (TAT 6-24 HRS)    EKG   Radiology No results found.  Procedures Procedures (including critical care time)  Medications Ordered in UC Medications - No data to display  Initial Impression / Assessment and Plan / UC Course  I have reviewed the triage vital signs and the nursing notes.  Pertinent labs & imaging results that were available during my care of the patient were reviewed by me and considered in my medical decision making (see chart for details).     *Reviewed importance of quarantine until test results are available.  Patient works from home. Final Clinical Impressions(s) / UC Diagnoses   Final diagnoses:  Close exposure to COVID-19 virus   SOB (shortness of breath)     Discharge Instructions     Go home to rest Drink plenty of fluids Take Tylenol for pain or fever You may take over-the-counter cough and cold medicines as needed You must quarantine at home until your test result is available You can check for your test result in MyChart    ED Prescriptions    None     PDMP not reviewed this encounter.   Eustace Moore, MD 07/12/20 Kristopher Oppenheim

## 2020-07-12 NOTE — Discharge Instructions (Signed)
Go home to rest Drink plenty of fluids Take Tylenol for pain or fever You may take over-the-counter cough and cold medicines as needed You must quarantine at home until your test result is available You can check for your test result in MyChart  

## 2020-07-13 LAB — SARS CORONAVIRUS 2 (TAT 6-24 HRS): SARS Coronavirus 2: NEGATIVE

## 2020-07-16 ENCOUNTER — Encounter: Payer: Self-pay | Admitting: Rehabilitative and Restorative Service Providers"

## 2020-07-16 ENCOUNTER — Other Ambulatory Visit: Payer: Self-pay

## 2020-07-16 ENCOUNTER — Ambulatory Visit (INDEPENDENT_AMBULATORY_CARE_PROVIDER_SITE_OTHER): Payer: No Typology Code available for payment source | Admitting: Rehabilitative and Restorative Service Providers"

## 2020-07-16 DIAGNOSIS — M542 Cervicalgia: Secondary | ICD-10-CM | POA: Diagnosis not present

## 2020-07-16 DIAGNOSIS — M5417 Radiculopathy, lumbosacral region: Secondary | ICD-10-CM

## 2020-07-16 DIAGNOSIS — R293 Abnormal posture: Secondary | ICD-10-CM

## 2020-07-16 NOTE — Therapy (Addendum)
[place], [place], [address] Phone: [phone]   Fax:  [phone]  Physical Therapy Treatment/Discharge  Patient Details  Name: Julia Michael MRN: [id number] Date of Birth: [birth year] Referring Provider (PT): Eunice Blase, MD   Encounter Date: 07/16/2020   PT End of Session - 07/16/20 1438    Visit Number 8    Number of Visits 15    Date for PT Re-Evaluation 29/79/89   recert on 12/28/92   PT Start Time 1740    PT Stop Time 1507    PT Time Calculation (min) 38 min    Activity Tolerance Patient tolerated treatment well    Behavior During Therapy [facility] for tasks assessed/performed           Past Medical History:  Diagnosis Date  . Anxiety   . Diabetes mellitus without complication ([place])   . PCOS (polycystic ovarian syndrome)     History reviewed. No pertinent surgical history.  There were no vitals filed for this visit.   Subjective Assessment - 07/16/20 1436    Subjective Pt. stated feeling back pain centered at 4/10 or so.   Pt. indicated neck pain at 3/10 or so.    Limitations Walking;Standing    How long can you stand comfortably? 10-15 min    How long can you walk comfortably? "it depends" - reports factors that impact are where she's at or what she's doing    Diagnostic tests xrays: mild DDD at L5-S1    Patient Stated Goals improve pain, walk without pain    Currently in Pain? Yes    Pain Score 4     Pain Location Back    Pain Orientation Lower    Pain Descriptors / Indicators Aching    Pain Type Acute pain    Pain Onset More than a month ago    Pain Frequency Constant    Aggravating Factors  lying down    Pain Relieving Factors flexion, turning to side    Pain Score 3    Pain Location Neck    Pain Orientation Posterior;Upper    Pain Descriptors / Indicators Aching    Pain Type Chronic pain    Pain Onset More than a month ago    Pain Frequency Intermittent    Aggravating Factors  work      Pain Relieving Factors popping, dry needling                             [facility] Adult PT Treatment/Exercise - 07/16/20 0001      Lumbar Exercises: Stretches   Lower Trunk Rotation 5 reps;Other (comment)   15 sec x 5 bilateral   Other Lumbar Stretch Exercise pball flexion stretch seated 30 sec x 3      Lumbar Exercises: Supine   Straight Leg Raises Limitations 2 x 10 bilateral c posterior pelvic tilt focus    Other Supine Lumbar Exercises posterior pelvic tilt 5 sec hold x 10            Trigger Point Dry Needling - 07/16/20 0001    Consent Given? Yes    Education Handout Provided Previously provided    Muscles Treated Back/Hip Lumbar multifidi   inferior lumbar paraspinals, multifidi   Lumbar multifidi Response Twitch response elicited                  PT Short Term Goals - 04/10/20 8144  PT SHORT TERM GOAL #1   Title perform formal neck assessment with goals to be written    Status Achieved    Target Date 04/17/20             PT Long Term Goals - 06/28/20 1427      PT LONG TERM GOAL #1   Title independent with HEP    Status On-going      PT LONG TERM GOAL #2   Title report pain < 5/10 with standing and walking > 30 min for improved mobility and job responsibilities    Baseline 4/10 today but can still get up to 7    Status On-going      PT LONG TERM GOAL #3   Title demonstrate improved postural awareness for improved tolerance with work activities    Baseline PT continuing to work on this    Status On-going      PT [place] #4   Title report neck pain < 4/10 for improved function    Baseline no neck pain last 2 sessions    Status Achieved      PT LONG TERM GOAL #5   Title improve Lt cervical rotation to at least 65 deg for improved function    Status Achieved                 Plan - 07/16/20 1452    Clinical Impression Statement Concordant symptoms in lumbar spine c L4, L5 cPA c mild restriction noted as  well as lumbar paraspinals inferior TrP.  Continued education on improed mobility in lumbar spine through HEP performance.    Personal Factors and Comorbidities Comorbidity 2    Comorbidities anxiety, DM    Examination-Activity Limitations Stand;Sleep;Sit;Stairs    Examination-Participation Restrictions Community Activity;Other   occupation   Stability/Clinical Decision Making Stable/Uncomplicated    Rehab Potential Good    PT Frequency 2x / week    PT Duration 6 weeks    PT Treatment/Interventions ADLs/Self Care Home Management;Cryotherapy;Ultrasound;Traction;Moist Heat;Electrical Stimulation;Gait training;Stair training;Functional mobility training;Therapeutic activities;Patient/family education;Neuromuscular re-education;Therapeutic exercise;Manual techniques;Dry needling;Taping    PT Next Visit Plan DN as appropriate.  Lumbar/cevical mobility gains c intervention. Postural control for lumbar to avoid excessive extension movement 2/2 increased lumbar lordosis    PT Home Exercise Plan Access Code: [email or web address]    Consulted and Agree with Plan of Care Patient           Patient will benefit from skilled therapeutic intervention in order to improve the following deficits and impairments:  Pain, Postural dysfunction, Improper body mechanics, Impaired flexibility, Decreased endurance, Decreased activity tolerance  Visit Diagnosis: Radiculopathy, lumbosacral region  Abnormal posture  Cervicalgia     Problem List There are no problems to display for this patient.   Scot Jun, PT, DPT, OCS, ATC 07/16/20  3:08 PM  PHYSICAL THERAPY DISCHARGE SUMMARY  Visits from Start of Care: 8  Current functional level related to goals / functional outcomes: See note   Remaining deficits: See note   Education / Equipment: HEP  Plan: Patient agrees to discharge.  Patient goals were partially met. Patient is being discharged due to not returning since the last visit.  ?????       Scot Jun, PT, DPT, OCS, ATC 09/04/20  1:59 PM    [facility] [address] [place], [place], [address] Phone: [phone]   Fax:  [phone]  Name: Julia Michael MRN: [id number] Date of Birth: 1995-12-05

## 2020-08-01 ENCOUNTER — Ambulatory Visit (HOSPITAL_COMMUNITY)
Admission: EM | Admit: 2020-08-01 | Discharge: 2020-08-01 | Disposition: A | Discharge status: Home / Self Care | Payer: No Typology Code available for payment source | Attending: Family Medicine | Admitting: Family Medicine

## 2020-08-01 ENCOUNTER — Emergency Department (HOSPITAL_COMMUNITY)
Admission: EM | Admit: 2020-08-01 | Discharge: 2020-08-01 | Disposition: A | Discharge status: Home / Self Care | Payer: No Typology Code available for payment source | Attending: Emergency Medicine | Admitting: Emergency Medicine

## 2020-08-01 ENCOUNTER — Encounter (HOSPITAL_COMMUNITY): Payer: Self-pay

## 2020-08-01 ENCOUNTER — Encounter: Payer: No Typology Code available for payment source | Admitting: Rehabilitative and Restorative Service Providers"

## 2020-08-01 ENCOUNTER — Other Ambulatory Visit: Payer: Self-pay

## 2020-08-01 ENCOUNTER — Encounter (HOSPITAL_COMMUNITY): Payer: Self-pay | Admitting: Emergency Medicine

## 2020-08-01 DIAGNOSIS — L299 Pruritus, unspecified: Secondary | ICD-10-CM | POA: Diagnosis not present

## 2020-08-01 DIAGNOSIS — W57XXXA Bitten or stung by nonvenomous insect and other nonvenomous arthropods, initial encounter: Secondary | ICD-10-CM | POA: Diagnosis not present

## 2020-08-01 DIAGNOSIS — Z5321 Procedure and treatment not carried out due to patient leaving prior to being seen by health care provider: Secondary | ICD-10-CM | POA: Insufficient documentation

## 2020-08-01 DIAGNOSIS — R21 Rash and other nonspecific skin eruption: Secondary | ICD-10-CM

## 2020-08-01 MED ORDER — HYDROXYZINE HCL 25 MG PO TABS: 25.0000 mg | ORAL_TABLET | Freq: Four times a day (QID) | ORAL | 0 refills | Status: AC

## 2020-08-01 MED ORDER — HYDROXYZINE HCL 25 MG PO TABS: 25.0000 mg | ORAL_TABLET | Freq: Four times a day (QID) | ORAL | 0 refills | Status: DC

## 2020-08-01 MED ORDER — TRIAMCINOLONE 0.1 % CREAM:EUCERIN CREAM 1:1: 1.0000 "application " | TOPICAL_CREAM | Freq: Three times a day (TID) | CUTANEOUS | 1 refills | Status: AC

## 2020-08-01 NOTE — ED Provider Notes (Signed)
Formatting of this note is different from the original.    [facility]    CSN: [id number]  Arrival date & time: 08/01/20  1806        History    Chief Complaint  Chief Complaint   Patient presents with   ? Rash/Itching     HPI  Pamela Shaw is a 24 y.o. female.  Complains of rash and itching that is widespread.  Went to the emergency room last night but left with a 17-hour wait.  Rash is mostly from the waist down but there are a few lesions from the waist up but none on her head.    HPI    Past Medical History:   Diagnosis Date   ? Anxiety    ? Diabetes mellitus without complication (HCC)    ? PCOS (polycystic ovarian syndrome)      There are no problems to display for this patient.    History reviewed. No pertinent surgical history.    OB History    No obstetric history on file.      Home Medications      Prior to Admission medications    Medication Sig Start Date End Date Taking? Authorizing Provider   sertraline (ZOLOFT) 50 MG tablet Take 50 mg by mouth daily.    [provider]     Family History  Family History   Problem Relation Age of Onset   ? Thyroid disease Mother    ? Healthy Father      Social History  Social History     Tobacco Use   ? Smoking status: Never Smoker   ? Smokeless tobacco: Never Used   Substance Use Topics   ? Alcohol use: Yes     Comment: socially   ? Drug use: No     Allergies    Ibuprofen, Pecan extract allergy skin test, and Pecan nut (diagnostic)    Review of Systems  Review of Systems   Skin: Positive for rash.        Not really rash but bites   All other systems reviewed and are negative.    Physical Exam  Triage Vital Signs  ED Triage Vitals   Enc Vitals Group      BP 08/01/20 2011 (!) 141/84      Pulse Rate 08/01/20 2011 (!) 115      Resp 08/01/20 2011 20      Temp 08/01/20 2011 98.1 F (36.7 C)      Temp Source 08/01/20 2011 Oral      SpO2 08/01/20 2011 100 %      Weight --       Height --       Head Circumference --       Peak Flow --       Pain Score  08/01/20 2009 0      Pain Loc --       Pain Edu? --       Excl. in GC? --      No data found.    Updated Vital Signs  BP (!) 141/84 (BP Location: Right Arm)   Pulse (!) 115   Temp 98.1 F (36.7 C) (Oral)   Resp 20   SpO2 100%     Visual Acuity  Right Eye Distance:    Left Eye Distance:    Bilateral Distance:      Right Eye Near:    Left Eye Near:  Bilateral Near:       Physical Exam  Vitals and nursing note reviewed.   Constitutional:       Appearance: Normal appearance.   Cardiovascular:      Rate and Rhythm: Normal rate.   Pulmonary:      Effort: Pulmonary effort is normal.      Breath sounds: Normal breath sounds.   Skin:     Comments: Multiple bites scattered throughout her lower body.  There is no rash per se.   Neurological:      Mental Status: She is alert.     UC Treatments / Results   Labs  (all labs ordered are listed, but only abnormal results are displayed)  Labs Reviewed - No data to display    EKG    Radiology  No results found.    Procedures  Procedures (including critical care time)    Medications Ordered in UC  Medications - No data to display    Initial Impression / Assessment and Plan / UC Course   I have reviewed the triage vital signs and the nursing notes.    Pertinent labs & imaging results that were available during my care of the patient were reviewed by me and considered in my medical decision making (see chart for details).        Rash, probably secondary to insect bites  Final Clinical Impressions(s) / UC Diagnoses     Final diagnoses:   None     Discharge Instructions    None      ED Prescriptions     None       PDMP not reviewed this encounter.    Frederica Kuster, MD  08/01/20 2035    Electronically signed by Frederica Kuster, MD at 08/01/2020  8:35 PM EDT

## 2020-08-01 NOTE — ED Triage Notes (Signed)
Formatting of this note might be different from the original.  Pt presents what she believes to be itchiness and rash on arms and back that started at the beginning of the week.   Electronically signed by Donavan Burnet, CMA at 08/01/2020  8:09 PM EDT

## 2020-08-01 NOTE — ED Provider Notes (Signed)
[facility]    CSN: [id number] Arrival date & time: 08/01/20  1806      History   Chief Complaint Chief Complaint  Patient presents with  . Rash/Itching    HPI Julia Michael is a 24 y.o. female.  Complains of rash and itching that is widespread.  Went to the emergency room last night but left with a 17-hour wait.  Rash is mostly from the waist down but there are a few lesions from the waist up but none on her head.  HPI  Past Medical History:  Diagnosis Date  . Anxiety   . Diabetes mellitus without complication (HCC)   . PCOS (polycystic ovarian syndrome)     There are no problems to display for this patient.   History reviewed. No pertinent surgical history.  OB History   No obstetric history on file.      Home Medications    Prior to Admission medications   Medication Sig Start Date End Date Taking? Authorizing Provider  sertraline (ZOLOFT) 50 MG tablet Take 50 mg by mouth daily.    [provider]    Family History Family History  Problem Relation Age of Onset  . Thyroid disease Mother   . Healthy Father     Social History Social History   Tobacco Use  . Smoking status: Never Smoker  . Smokeless tobacco: Never Used  Substance Use Topics  . Alcohol use: Yes    Comment: socially  . Drug use: No     Allergies   Ibuprofen, Pecan extract allergy skin test, and Pecan nut (diagnostic)   Review of Systems Review of Systems  Skin: Positive for rash.       Not really rash but bites  All other systems reviewed and are negative.    Physical Exam Triage Vital Signs ED Triage Vitals  Enc Vitals Group     BP 08/01/20 2011 (!) 141/84     Pulse Rate 08/01/20 2011 (!) 115     Resp 08/01/20 2011 20     Temp 08/01/20 2011 98.1 F (36.7 C)     Temp Source 08/01/20 2011 Oral     SpO2 08/01/20 2011 100 %     Weight --      Height --      Head Circumference --      Peak Flow --      Pain Score 08/01/20 2009 0     Pain  Loc --      Pain Edu? --      Excl. in GC? --    No data found.  Updated Vital Signs BP (!) 141/84 (BP Location: Right Arm)   Pulse (!) 115   Temp 98.1 F (36.7 C) (Oral)   Resp 20   SpO2 100%   Visual Acuity Right Eye Distance:   Left Eye Distance:   Bilateral Distance:    Right Eye Near:   Left Eye Near:    Bilateral Near:     Physical Exam Vitals and nursing note reviewed.  Constitutional:      Appearance: Normal appearance.  Cardiovascular:     Rate and Rhythm: Normal rate.  Pulmonary:     Effort: Pulmonary effort is normal.     Breath sounds: Normal breath sounds.  Skin:    Comments: Multiple bites scattered throughout her lower body.  There is no rash per se.  Neurological:     Mental Status: She is alert.  UC Treatments / Results  Labs (all labs ordered are listed, but only abnormal results are displayed) Labs Reviewed - No data to display  EKG   Radiology No results found.  Procedures Procedures (including critical care time)  Medications Ordered in UC Medications - No data to display  Initial Impression / Assessment and Plan / UC Course  I have reviewed the triage vital signs and the nursing notes.  Pertinent labs & imaging results that were available during my care of the patient were reviewed by me and considered in my medical decision making (see chart for details).     Rash, probably secondary to insect bites Final Clinical Impressions(s) / UC Diagnoses   Final diagnoses:  None   Discharge Instructions   None    ED Prescriptions    None     PDMP not reviewed this encounter.   Frederica Kuster, MD 08/01/20 2035

## 2020-08-01 NOTE — ED Notes (Signed)
Pt stated she was leaving.  

## 2020-08-01 NOTE — ED Triage Notes (Signed)
Pt presents what she believes to be itchiness and rash on arms and back that started at the beginning of the week.

## 2020-08-01 NOTE — ED Triage Notes (Signed)
Patient reports intermittent skin itching at arms and back onset this week .

## 2020-12-13 NOTE — Progress Notes (Signed)
Formatting of this note might be different from the original.    [place] Department of Health notified of results per regulations      Electronically signed by Leta Speller, LPN at 97/94/8016  6:34 PM EST

## 2021-12-08 NOTE — ED Provider Notes (Signed)
Formatting of this note is different from the original.    [facility]    ED Provider Note    Pamela Shaw 26 y.o. female DOB: 06-Dec-1995 MRN: [id number]     Medical screening initiated and orders placed by Madilyn Hook, PA-C.  12/08/2021 / 4:09 PM    HPI: Shortness of breath, left-sided chest pain x1 week.  No cough, congestion, lower extremity swelling, hemoptysis.  Also complaining of vaginal discharge x2 weeks.  No hematuria, dysuria, vaginal bleeding or abdominal pain    ROS: Negative for headache, dizziness, syncope, fever    PE: Afebrile, nontoxic-appearing.  GCS 15.  Lungs clear to auscultation bilaterally, regular rate and rhythm.  No abdominal TTP    Assessment/plan: Swab, labs, chest x-ray.  PERC negative  History     Chief Complaint   Patient presents with   ? Shortness of Breath     Feels SOB just talking for the past week   ? Chest Pain     Left sided chest pain last week   ? Vaginal Discharge     Hxt of BV.     27 year old female presents with a chief complaint of shortness of breath x 1 week, as well as chest pain. Also complains of vaginal discharge. Sexually active, low suspicoon for STI, no dysuria. Dyspnea at rest, worse with talking. Some ear "popping", very mild congestion. No cough. No myalgias or arthralgias. No leg swelling or recent travel. History of diabetes, previously took metformin, had medication stopped.    History provided by:  Patient    No past medical history on file.    No past surgical history on file.    Social History     Substance and Sexual Activity   Alcohol Use Not on file     Social History     Tobacco Use   Smoking Status Not on file   Smokeless Tobacco Not on file     No existing history information found.  Social History     Substance and Sexual Activity   Drug Use Not on file           Allergies   Allergen Reactions   ? Ibuprofen Hives   ? Pecan Nut (Diagnostic) Hives     Discharge Medication List as of 12/08/2021  6:03 PM        Review of Systems     Review of Systems   Constitutional: Negative for chills and fever.   HENT: Positive for congestion, sinus pressure and tinnitus. Negative for ear pain ( popping, not pain), facial swelling, hearing loss, sore throat, trouble swallowing and voice change.    Eyes: Negative for redness and visual disturbance.   Respiratory: Positive for shortness of breath. Negative for cough.    Cardiovascular: Positive for chest pain. Negative for palpitations and leg swelling.   Gastrointestinal: Negative for abdominal pain, diarrhea, nausea and vomiting.   Endocrine: Negative for polydipsia, polyphagia and polyuria.   Genitourinary: Positive for vaginal discharge. Negative for dysuria, genital sores and hematuria.   Musculoskeletal: Negative for arthralgias and joint swelling.   Skin: Negative for rash.     Physical Exam     ED Triage Vitals [12/08/21 1608]   BP 117/81   Heart Rate 68   Resp 18   SpO2 98 %   Temp 98 F (36.7 C)     Physical Exam   Nursing note and vitals reviewed.  Constitutional: She appears well-developed and  well-nourished. She does not appear distressed, does not appear ill and no respiratory distress.   HENT:   Head: Normocephalic and atraumatic.   Right Ear: Normal external ear. Normal ear canal and tympanic membrane normal.   Left Ear: Normal external ear. Normal ear canal and tympanic membrane normal.   Mouth/Throat: No oropharyngeal exudate, posterior oropharyngeal edema and posterior oropharyngeal erythema.Uvula is midline. No uvular swelling and erythema. No hoarse voice. Voice normal.   Eyes: EOM are intact. Pupils are equal, round, and reactive to light. Right eye: no conjunctival injection. Left eye: no conjunctival injection. No conjunctival pallor.   Neck: Voice normal. No JVD.   Cardiovascular: Normal rate, regular rhythm and normal heart sounds.   Pulmonary/Chest: No respiratory distress. Not tachypneic. Respiratory effort normal and breath sounds normal.   Abdominal:  Soft. There is no abdominal tenderness. Abdomen not distended. There is no CVA tenderness.   Genitourinary:   Labia: Labia normal.   Vagina: No vaginal discharge and no vaginal bleeding present.   Cervix: No cervical friability.   Musculoskeletal: Normal range of motion. no edema.    Neurological: She is alert and oriented to person, place, and time. Moves all extremities equally. Gait normal. She has normal speech.   Skin: Skin is warm.   Psychiatric: She has a normal mood and affect. Her behavior is normal. Thought content normal.     ED Course     Lab results:    WET PREP SCREEN - Abnormal       Result Value    Clue Cells Moderate (*)     Wet Prep Yeast None Seen      Wet Prep Trichomonas None Seen      Wet Prep WBC Few     CBC AND DIFFERENTIAL - Abnormal    WBC 6.7      RBC 4.71      HGB 13.9      HCT 42.3      MCV 90      MCH 29.5      MCHC 32.9      Plt Ct 350      RDW SD 43.9      MPV 8.3 (*)     NRBC% 0.0      NRBC 0.000      NEUTROPHIL % 48.5 (*)     LYMPHOCYTE % 40.6 (*)     MONOCYTE % 8.1      Eosinophil % 2.1      BASOPHIL % 0.4      IG% 0.300      ABSOLUTE NEUTROPHIL COUNT 3.25      ABSOLUTE LYMPHOCYTE COUNT 2.7      MONO ABSOLUTE 0.5      EOS ABSOLUTE 0.1      BASO ABSOLUTE 0.0      IG ABSOLUTE 0.020     COMPREHENSIVE METABOLIC PANEL - Abnormal    Na 138      Potassium 4.0      Cl 101      CO2 27      AGAP 10      Glucose 107 (*)     BUN 11      Creatinine 0.85      Ca 9.7      ALK PHOS 88      T Bili 0.62      Total Protein 8.2      Alb 4.1      GLOBULIN 4.1  ALBUMIN/GLOBULIN RATIO 1.0 (*)     BUN/CREAT RATIO 12.9      ALT 22      AST 17      eGFR 98      Comment: Normal GFR (glomerular filtration rate) > 60 mL/min/1.73 meters squared, < 60 may include impaired kidney function.  Calculation based on the Chronic Kidney Disease Epidemiology Collaboration (CK-EPI)equation refit without adjustment for race.   URINALYSIS ONLY - NO SYMPTOMS - Abnormal    Urine Color Yellow      Urine Appearance Clear       Urine Specific Gravity 1.014      Urine pH 6.5      Urine Protein - Dipstick Negative      Urine Glucose Negative      Urine Ketones Negative      Urine Bilirubin Negative      Urine Blood >1.0 (*)     Urine Nitrite Negative      Urine Urobilinogen <2      Urine Leukocyte Esterase Negative      Urine Squamous Epithelial Cells 2      Urine RBC 53 (*)     Urine Bacteria Rare (*)     Urine Mucous Few (*)     Urine Renal Epithelial Cells 1 (*)     UA Microscopic Yes Micro (*)    COVID-19, FLU A+B AND RSV - Normal    Flu A Negative      Flu B Negative      RSV PCR Negative      SARS-COV-2 Not Detected      Narrative:     SARS-COV-2 (COVID-19)PCR-Negative results do not preclude SARS-CoV-2 infection and should not be used as the sole basis for patient management decisions. Negative results must be combined with clinical observations, patient history, and epidemiological information.    Flu and/or RSV - Negative results do not preclude the presence of Flu or RSV virus and should not be used as the sole basis for treatment or other patient management decisions. False negative results may occur if virus is present at levels below the analytical limit of detection.    This test detects Influenza A, Influenza B, and Respiratory Syncytial Virus and SARS-COV-2 (COVID-19) by PCR.    Testing was performed using the CEPHEID SARS-CoV-2 EUA ASSAY:   The Cepheid SARS-CoV-2 EUA assay has not been FDA cleared or approved.  It has been authorized by FDA under an Emergency Use Authorization (EUA).  The test has been authorized only for the detection of nucleic acid from SARS-CoV-2, not for any other viruses or pathogens.  It is only authorized for the duration of time the declaration that circumstances exist justifying the authorization of the emergency use of in vitro diagnostic tests for detection of SARS-CoV-2 virus and/or diagnosis of COVID-19 infection under section 564(b) (1) of the Act, 21 U.S.C [phone] (b) (1), unless the  authorization is terminated or revoked sooner.     Link to Patient Fact Sheet:   PinkCheek.be     Link to Provider Fact Sheet   GravelBags.it      CHLAMYDIA / GONOCOCCUS, NAA    Chlamydia Trachomatis, NAA Negative      Neisseria Gonorrhoeae By PCR Negative      Narrative:     Performed at:  [facility]  [address], [place], [place]  [id number]  Lab Director: Rush Farmer MD, Phone:  [phone]   POCT HCG    HCG,POC Negative  Lot Number [id number]      Expiration Date 2022-02-14      INTERNAL QC CHECK PERFORMED Acceptable       Imaging:    XR CHEST PA AND LATERAL    Narrative:     FRONTAL AND LATERAL RADIOGRAPHS OF THE CHEST:    PROVIDED CLINICAL INDICATION: Shortness of breath  ADDITIONAL CLINICAL INDICATION: None available     COMPARISON: None available    FINDINGS:    The cardiac silhouette is unremarkable.  Pulmonary vasculature is normal.  There is no evidence of pneumothorax or pleural effusion.  The lungs are clear without evidence of focal consolidation.  The osseous structures are age-appropriate without acute abnormality.     Impression:     IMPRESSION:    No evidence of active disease.    Electronically Signed by: Hermina Staggers, MD on 12/08/2021 4:29 PM     ECG:  ECG Results       ECG 12 lead (Final result)  Result time 12/08/21 16:30:25    Final result            Narrative:    Diagnosis Class Abnormal  Acquisition Device [id number]  Ventricular Rate 79  Atrial Rate 79  P-R Interval 160  QRS Duration 76  Q-T Interval 354  QTC Calculation(Bazett) 405  Calculated P Axis 35  Calculated R Axis 47  Calculated T Axis 5    Diagnosis Normal sinus rhythm  Nonspecific T wave abnormality  Abnormal ECG  No previous ECGs available  Sunday Spillers ([id number]) on 0/98/1191 4:78:29 PM certifies that he/she has reviewed the ECG tracing and confirms the independent  interpretation is correct.                   HEAR Score    History: Mostly low risk features  ECG:  Non specific repolarisation disturbance  Age: Less than 45 yrs  Risk Factors: No risk factors known  HEAR Score Total: 1                  Pre-Sedation  Procedures      Medical Decision Making  26 year old female presents to the ED with a chief complaint of shortness of breath, left-sided chest pain.  Symptoms intermittent over the past 1 week.  Feels short of breath just when talking.  No nasal congestion, though feels sinus pressure, occasional ear popping.  No ear pain.  No sore throat.    Consideration to cardiac etiology, though unremarkable EKG without arrhythmia or ischemic change.  Chest x-ray imaging also unremarkable.  Consideration to pneumonia, infectious causes such as COVID/flu/RSV, testing today is negative    Patient with remote history of diabetes, previously took metformin, has been off metformin for several years.  Evaluation here for uncontrolled diabetes with concern for DKA given dyspnea.  Lab testing with very mild hyperglycemia, glucose 107, clearly not uncontrolled diabetes or suggestion of acidosis or renal failure or DKA that would also explain acute dyspnea.    Vaginal discharge, sexually active.  Consideration to STDs.  Mild urinary urgency, frequency.  Low suspicion of STDs.  Normal physical exam with no noted vaginal discharge, or cervicitis.  Will await wet prep, GC chlamydia testing.    Amount and/or Complexity of Data Reviewed  Labs: ordered. Decision-making details documented in ED Course.  Radiology: ordered and independent interpretation performed.     Details: Chest x-ray without infiltrates, no cardiomegaly  ECG/medicine tests: ordered and independent interpretation performed.    Risk  Prescription drug management.    Provider Communication    Discharge Medication List as of 12/08/2021  6:03 PM     START taking these medications    Details   metroNIDAZOLE (METROGEL-VAGINAL) 0.75% vaginal gel Place vaginally 2 (two) times daily for 7 days., Starting Sun 12/08/2021, Until Sun  12/15/2021, Normal         Discharge Medication List as of 12/08/2021  6:03 PM       Discharge Medication List as of 12/08/2021  6:03 PM       Clinical Impression     Final diagnoses:   Dyspnea, unspecified type   Acute dyspnea   Chest pain, unspecified type   Vaginal discharge   Bacterial vaginosis     ED Disposition     ED Disposition   Discharge    Condition   Stable    Comment   --                  Follow-up Information     [facility] (PCP Request).    Specialty: Care Coordination  Contact information:  [phone]                Electronically signed by:    Darrol Poke, MD  12/11/21 1229    Electronically signed by Darrol Poke, MD at 12/11/2021 12:29 PM EST

## 2022-04-29 NOTE — Telephone Encounter (Signed)
Formatting of this note might be different from the original.  Inbound Call - Pharmacy [facility]    Londin Antone Feb 01, 1996  Was HIPAA verified? Yes    What pharmacy is the caller calling into?  [facility]    Medication(s) Requested: amoxicillin    Summary of call: [facility] calling for copy, call transferred to [facility]   Electronically signed by Esmond Plants at 04/29/2022  1:05 PM EDT

## 2022-05-01 NOTE — ED Triage Notes (Signed)
Formatting of this note might be different from the original.  Patient reports rash under right breast and when rubbed puss came out. Patient reports feeling like she has to breath out of mouth and doesn't know why  Electronically signed by Malachi Carl, RN at 05/01/2022 11:10 PM EDT

## 2022-05-02 NOTE — ED Provider Notes (Signed)
Formatting of this note is different from the original.  [facility] EMERGENCY DEPARTMENT    Time of Arrival:   05/01/22 2305     Final diagnoses:   [L30.9] Dermatitis (Primary)     Medical Decision Making:      Differential Diagnosis:   dermatitis, yeast infection, excoriations, cellulitis, abscess    ED Course/Consults: exam shows skin irritation, excoriations - no active drainage, no induration, Will have her continue the Augmentin she recently started, wound care, monitoring, return precautions given. Patient  verbalizes agreement and understanding of treatment plan. Return precautions given.      Documentation/Prior Results Review:  Nursing notes    Imaging Interpreted by me: Not Applicable    No orders to display     The differential diagnosis and / or critical care lists were considered including infections, sepsis, severe sepsis, and septic shock and found unlikely unless otherwise documented in the final clinical impression or diagnosis list.     Disposition:  Home    .    Discharge Medication List as of 05/02/2022 12:14 AM       Chief Complaint   Patient presents with    RASH     HPI 26 y/o F presents for a rash under her right breast. Started as an itchy area, she has been scratching at it, and this evening was draining. She just recently started Augmentin for another issue. No fevers.    Review of Systems:  Skin:  Positive for rash.   All other systems reviewed and are negative.     Physical Exam  Vitals reviewed.   HENT:      Head: Normocephalic and atraumatic.   Eyes:      Pupils: Pupils are equal, round, and reactive to light.   Skin:     General: Skin is warm and dry.      Capillary Refill: Capillary refill takes less than 2 seconds.      Comments: Under right breast with erythema and excoriations. No drainage, mild tenderness    Neurological:      Mental Status: She is alert.     Past Medical History:   Diagnosis Date    Anxiety     Depression     Diabetes mellitus (HCC)     GERD  (gastroesophageal reflux disease)      No past surgical history on file.  Family History   Problem Relation Age of Onset    Asthma Sister      Social History     Occupational History    Not on file   Tobacco Use    Smoking status: Never    Smokeless tobacco: Never   Substance and Sexual Activity    Alcohol use: No    Drug use: No    Sexual activity: Not on file     No outpatient medications have been marked as taking for the 05/01/22 encounter [facility] Encounter).     Allergies   Allergen Reactions    Ibuprofen hives    Pecan Nut unknown     Vital Signs:  Patient Vitals for the past 72 hrs:   Temp Heart Rate Resp BP BP Mean SpO2 Weight   05/01/22 2310 97.8 F (36.6 C) 80 18 134/67 89 MM HG 98 % 93 kg (205 lb)     Diagnostics:  Labs:  No results found for this visit on 05/01/22.    Medications given in the ED  Medications - No data to  display      Electronically signed by Darryl Lent, MD at 05/04/2022  5:58 AM EDT    Associated attestation - Darryl Lent, MD - 05/04/2022  5:58 AM EDT  Formatting of this note might be different from the original.  NP/PA record reviewed but patient not seen by me.  I agree w recorded chart.     Mendel Corning, MD

## 2022-05-02 NOTE — ED Notes (Signed)
Formatting of this note might be different from the original.  Pain assessment on discharge was 0.  Condition stable.  Patient discharged to home.  Patient education was completed:  yes  Education taught to:  patient  Teaching method used was discussion.  Understanding of teaching was good.  Patient was discharged ambulatory.  Discharged with self.  Valuables were given to: patient. Work note refused at this time  Electronically signed by Domingo Mend, RN at 05/02/2022 12:21 AM EDT

## 2022-05-27 LAB — COMPREHENSIVE METABOLIC PANEL

## 2022-05-27 LAB — URINALYSIS WITH MICROSCOPIC

## 2022-08-15 ENCOUNTER — Encounter: Attending: Urology

## 2022-08-15 NOTE — Progress Notes (Unsigned)
Pamela Shaw  DOB 09-May-1996  Encounter date 08/15/2022    No diagnosis found.    Assessment and Plan:  UA today ***  Urinary frequency        RTO in *** months to reassess.     Discussion:  There is no height or weight on file to calculate BMI.    No chief complaint on file.      History of Present Illness:  Pamela Shaw is a 26 y.o. female who presents today in consultation and referred by No primary care provider on file..      Today, patient is doing well.     {deniesreports:56071} dyspareunia  {deniesreports:56071} pelvic pain   {deniesreports:56071} vaginal discharge   {deniesreports:56071} prior hysterectomy  {deniesreports:56071} prior abdominal surgeries     Neurological issues: {yesno:56072}  Pelvic or back issues: {yesno:56072}   Prior kidney or bladder surgeries: {yesno:56072}  Prior history of nephrolithiasis: {yesno:56072}    No recent infections  Asymptomatic for infection today.  Denies any gross hematuria or dysuria.  No N/V/F/C.     Past Medical History:   Diagnosis Date    Diabetes mellitus (HCC)     GERD (gastroesophageal reflux disease)        No past surgical history on file.    No current outpatient medications on file.    Not on File    Social History     Socioeconomic History    Marital status: Not on file     Spouse name: Not on file    Number of children: Not on file    Years of education: Not on file    Highest education level: Not on file   Occupational History    Not on file   Tobacco Use    Smoking status: Never    Smokeless tobacco: Never   Substance and Sexual Activity    Alcohol use: Not Currently    Drug use: Never    Sexual activity: Not on file   Other Topics Concern    Not on file   Social History Narrative    Not on file     Social Determinants of Health     Financial Resource Strain: Not on file   Food Insecurity: Not on file   Transportation Needs: Not on file   Physical Activity: Not on file   Stress: Not on file   Social Connections: Not on file   Intimate Partner  Violence: Not on file   Housing Stability: Not on file       Family History   Problem Relation Age of Onset    Asthma Sister          PHYSICAL EXAMINATION:     There were no vitals taken for this visit.  Constitutional: Well developed, well-nourished female in no acute distress.   CV:  No peripheral swelling noted  Respiratory: No respiratory distress or difficulties  Abdomen:  Soft and nontender. No masses. No suprapubic tenderness.  GU Female 08/15/2022:    Urethral Hypermobility: {yesno:56072}  Urethral lesions: {yesno:56072}    Caruncle: {yesno:56072}    Polyp: {yesno:56072}    Prolapse: {yesno:56072}  Demonstrable Stress Urinary Incontinence: {yesno:56072}  Pelvic muscles tenderness: {yesno:56072}   OI muscles: {yesno:56072}   LA muscles:{yesno:56072}  Prior Hysterectomy: {yesno:56072}  Pelvic organ prolapse: {yesno:56072}   Cystocele: {YESNOGRADE:56074}   Rectocele: {YESNOGRADE:56074}   Enterocele: {YESNOGRADE:56074}   Apical: {YESNOGRADE:56074}  Skin:  Normal color. No evidence of jaundice.     Neuro/Psych:  Patient  with appropriate affect.  Alert and oriented.    Lymphatic:   No enlargement of supraclavicular lymph nodes.  LE: No edema.  Musculoskeletal: PS muscle tenderness: {yesno:56072}                                SI joint tenderness: {yesno:56072}           Pubic symphysis tenderness: {yesno:56072}          Adductor muscle tenderness: {yesno:56072}    Catheterized PVR Procedure: ***cc (Order [id number]***)  The genital and perineal areas were cleansed with antiseptic solution on the cotton balls.  With clean gloves the area was cleansed with antiseptic solution again.  I applied sterile lubricant liberally to the catheter tip, lubricating at least six inches of the catheter.  A 14 fr cath catheter was inserted into the urinary meatus. When urine started to flow, I inserted the catheter approximately one inch further without difficulty and bladder drained.      Catheter size:  14   Type: Strait tip    Material: All silicone     Urine was:   clear  Specimen obtained:  {YES/NO:18482}      Labs & Imaging:   No results found for this visit on 08/15/22.            A copy of today's office visit with all pertinent imaging results and labs were sent to the referring physician,No primary care provider on file.      York Pellant MD  [facility] [place]  [address]   [place], [place] [address]  [phone] (office)      [facility]   [address]   [place], [place] [address]   Phone: [phone]    Fax: [phone]    Medical documentation is provided with the assistance of Coral Else, medical scribe for Coral Else on 08/15/2022

## 2022-10-13 ENCOUNTER — Encounter: Admit: 2022-10-13 | Discharge: 2022-10-13 | Payer: PRIVATE HEALTH INSURANCE

## 2022-10-13 DIAGNOSIS — R3121 Asymptomatic microscopic hematuria: Secondary | ICD-10-CM

## 2022-10-13 LAB — AMB POC URINALYSIS DIP STICK AUTO W/O MICRO
Bilirubin, Urine, POC: NEGATIVE
Glucose, Urine, POC: NEGATIVE
Ketones, Urine, POC: NEGATIVE
Leukocyte Esterase, Urine, POC: NEGATIVE
Nitrite, Urine, POC: NEGATIVE
Protein, Urine, POC: NEGATIVE
Specific Gravity, Urine, POC: 1.025 (ref 1.001–1.035)
Urobilinogen, POC: 0.2
pH, Urine, POC: 7 (ref 4.6–8.0)

## 2022-10-13 NOTE — Progress Notes (Unsigned)
Dr. Wyvonnia Dusky is the supervising physician today      ASSESSMENT:     OAB    Microhematuria    H/O Kidney stones    H/O Recurrent BV      PLAN:     UA today-  Urine culture sent out to check for Mycoplasma and Ureaplasma  Discussed how diabetes can cause polyuria and polydipsia  Recommended tight glycemic control to prevent urinary tract infections  CTU to further evaluate microhematuria  RTC for cystoscopy     Chief Complaint   Patient presents with    Other     OAB     HISTORY OF PRESENT ILLNESS:  Pamela Shaw is a 26 y.o. female with a history of T2DM and recurrent BV who is seen in consultation as referred by Dr. Metro Kung for urinary frequency. She is currently taking Bactrim DS 800-160 mg BID x 7 days for a recently diagnosed UTI.     Baseline urinary Symptoms  Nocturia: 3   Frequency: Yes, she can void about 3 x every 1 hour  Hesitancy: on occasion, she may have to even to push  Urgency: Yes, she had UUI close calls   SUI:No             Dysuria: No  Hematuria: Yes, it started recently with the UTI  FOS: Strong-slow    H/O kidney stones: Yes. Last episode it was years ago. .3    Denies anticoug***  Denies GU trauma or surgery    Fhx: Denies bladder cancer or renal cancer    Social Hx:   Never smoker  Denies Health visitor exposure, pelvic radiation, or chemotherapy exposure      Past Medical History:   Diagnosis Date    Diabetes mellitus (HCC)     GERD (gastroesophageal reflux disease)        No past surgical history on file.    Social History     Tobacco Use    Smoking status: Never    Smokeless tobacco: Never   Vaping Use    Vaping Use: Never used   Substance Use Topics    Alcohol use: Not Currently    Drug use: Never       Allergies   Allergen Reactions    Ibuprofen Hives    Pecan Nut (Diagnostic) Hives and Itching    Pecan Extract Allergy Skin Test Hives     Other reaction(s): Other (See Comments), unknown       Family History   Problem Relation Age of Onset    Asthma Sister        Current  Outpatient Medications   Medication Sig Dispense Refill    sertraline (ZOLOFT) 25 MG tablet Take 1 tablet by mouth daily      sulfamethoxazole-trimethoprim (BACTRIM DS) 800-160 MG per tablet Take 1 tablet by mouth 2 times daily       No current facility-administered medications for this visit.         Review of Systems   Review of Systems         PHYSICAL EXAMINATION: Physical Exam         REVIEW OF LABS AND IMAGING:    No results found for this visit on 10/13/22.      Any elements of the PMH, FH, SHx, ROS, or preliminary elements of the HPI that were entered by a medical assistant have been reviewed in full.    A copy of today's office visit with all pertinent imaging  results and labs were sent to the referring physician.        Saunders Glance, MPA, PA-C  [facility] [place]  [address], [place] [address]  Office: [phone]  Fax: [phone]

## 2022-10-13 NOTE — Progress Notes (Signed)
Icelyn Navarrete has an order for  CT UROGRAM      ALL FEMALE PATIENTS NEEDING AN MRI OF PELVIS OR PROSTATE, MUST BE SCHEDULED AT ONE OF THE FOLLOWING:    **MRI/CT (Preferred [facility])  **[facility] [place] (Preferred [facility])  **[facility] (Preferred [facility])  [facility]  [place]      To be done at [facility] by:  JANUARY 10,2024    Patient has a follow-up appointment:  Yes     If MRI, does patient have a pacemaker:   No    Order has been placed in connect care:  Yes    Is this a STAT order:  No

## 2022-10-14 NOTE — Progress Notes (Signed)
Faxed imaging to [facility] ([place]) [phone]; CTU; NEED BY 11/26/2022  (Please allow at least 72 hours for processing);PATIENT MAY CALL [phone] TO SCHEDULE

## 2022-10-17 ENCOUNTER — Encounter

## 2022-10-17 LAB — GENITAL MYCOPLASMAS NAA, URINE
MYCOPLASMA GENITALIUM,URINE: NEGATIVE
Mycoplasma Hominis,Urine: NEGATIVE
Ureaplasma Spp,Urine: POSITIVE — AB

## 2022-10-17 MED ORDER — METRONIDAZOLE 0.75 % VA GEL
0.75 % | Freq: Every day | VAGINAL | 1 refills | Status: AC
Start: 2022-10-17 — End: 2022-10-22

## 2022-10-17 MED ORDER — DOXYCYCLINE HYCLATE 100 MG PO TABS
100 MG | ORAL_TABLET | Freq: Two times a day (BID) | ORAL | 0 refills | Status: AC
Start: 2022-10-17 — End: 2022-10-27

## 2022-10-17 NOTE — Other (Signed)
Patient was notified via telephone regarding most recent urine culture results that was positive for Ureaplasma.  I sent in doxycycline 100 mg twice daily for 10 days.  Additionally, I sent in MetroGel to be done daily for the next 5 days.      All questions were answered

## 2022-10-21 LAB — CYTOLOGY, URINE

## 2022-10-21 NOTE — Other (Signed)
Patient was notified via MyChart regarding most recent urine cytology results which were negative for malignancy

## 2022-10-21 NOTE — Progress Notes (Signed)
re-faxed

## 2022-11-06 NOTE — Progress Notes (Signed)
Sch 11/28/2022  1:00 PM CT UROGRAPHY 30 min [facility] Scan [facility] CT RM 1 GE 64

## 2022-12-01 ENCOUNTER — Encounter

## 2022-12-01 NOTE — Other (Signed)
Patient was notified via MyChart regarding most recent CT scan results.

## 2022-12-24 ENCOUNTER — Encounter: Admit: 2022-12-24 | Discharge: 2022-12-24 | Payer: PRIVATE HEALTH INSURANCE | Attending: Urology

## 2022-12-24 DIAGNOSIS — R3121 Asymptomatic microscopic hematuria: Secondary | ICD-10-CM

## 2022-12-24 LAB — AMB POC URINALYSIS DIP STICK AUTO W/O MICRO
Bilirubin, Urine, POC: NEGATIVE
Glucose, Urine, POC: NEGATIVE
Ketones, Urine, POC: NEGATIVE
Leukocyte Esterase, Urine, POC: NEGATIVE
Nitrite, Urine, POC: NEGATIVE
Protein, Urine, POC: NEGATIVE
Specific Gravity, Urine, POC: 1.03 (ref 1.001–1.035)
Urobilinogen, POC: 0.2
pH, Urine, POC: 6 (ref 4.6–8.0)

## 2022-12-24 NOTE — Progress Notes (Signed)
CYSTOSCOPY PROCEDURE    Patient Name: Pamela Shaw Date of Procedure: 12/24/2022     Preoperative Diagnosis: Micro hematuria  Postoperative Diagnosis: Same  Procedure: Cystoscopy     Surgeon: Mills Koller, MD  Assistant: None    This procedure has been fully reviewed with the patient, and written informed consent has been obtained.    History/Imaging:   Pamela Shaw is a 27 y.o. black female with h/o micro hematuria.   10/13/2022 patient seen by Raoul Pitch, PA, arranged cystoscopy for hematuria work-up    10/20/2022 Urine Cytology: Negative  11/28/2022 CT Urogram:   Genitourinary: Multiple bilateral nonobstructing nephroliths measuring up to 4 mm on the right (axial image 46/102). Normal kidneys otherwise. Normal ureters and bladder. Normal uterus. No suspicious adnexal masses.   Above results reviewed w/ patient.     Noctura 2-3x/night but has sleeping issues.  Daytime urinary Frequency q 1-2 hr  No incontinence.  No slow stream or hesitancy.  No dysuria.  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        Results for orders placed or performed in visit on 12/24/22   AMB POC URINALYSIS DIP STICK AUTO W/O MICRO   Result Value Ref Range    Color, Urine, POC Yellow     Clarity, Urine, POC Clear     Glucose, Urine, POC Negative     Bilirubin, Urine, POC Negative     Ketones, Urine, POC Negative     Specific Gravity, Urine, POC 1.030 1.001 - 1.035    Blood, Urine, POC Trace-lysed     pH, Urine, POC 6.0 4.6 - 8.0    Protein, Urine, POC Negative     Urobilinogen, POC 0.2 mg/dL     Nitrite, Urine, POC Negative     Leukocyte Esterase, Urine, POC Negative        Universal Procedure Pause:    1. Correct patient confirmed:  yes    2. Allergies confirmed:  yes    3. Patient taking antiplatelet medications:  no    4. Patient taking anticoagulants:  no    5. Correct procedure and side confirmed and consent signed:  yes    6. Correct antibiotics confirmed:   yes    Consent:  All risks, benefits and options were reviewed in detail and the patient agrees to procedure. Risks include but are not limited to bleeding, infection, sepsis, death, dysuria and others.       Procedure:  A time-out was performed to properly identify patient, the procedure to be performed.   The patient was placed in the supine position and frog-leg position, and prepped and draped in the normal fashion. 5 ml of 4% Lidocaine gel was placed in the urethra. Once adequate anesthesia was achieved; the flexible cystoscope was placed into the bladder.    Meatus: Normal   Urethra: Normal   Bladder neck: Open   Trigone: Normal   Trabeculation: None   Diverticuli: No   Lesion: No       Specimen: None  Complications: None  Blood Loss: Minimal to none  Condition/Disposition: Stable, d/c to home.    Assessment:  1. Micro Hematuria, negative w/u.   2.  Bilateral small kidney stones (largest 4 mm on right) on 11/28/2022 CT Urogram --no symptoms  3. OAB w/ urinary frequency -- partly d/t high fluid intake     Plan:  1. See today's separate progress note  Decrease total fluid intake by 25-30% to decrease urinary frequency    RTC 3 months to recheck UA, voiding status -- may need to consider OAB meds.       _________________________________  Physician: Mills Koller, MD     Cc;   No primary care provider on file.

## 2023-01-05 NOTE — Progress Notes (Signed)
12/24/2022      Assessment:  1. Micro Hematuria, negative w/u.   -- Today's 12/24/2022 Cystoscopy with no bladder lesions.    2.  Bilateral small kidney stones (largest 4 mm on right) on 11/28/2022 CT Urogram --no symptoms  3. OAB w/ urinary frequency -- partly d/t high fluid intake     Plan:  1. Decrease total fluid intake by 25-30% to decrease urinary frequency    RTC 3 months to recheck UA, voiding status -- may need to consider OAB meds.    CC: Hematuria, OAB      HISTORY OF PRESENT ILLNESS:  Pamela Shaw is a 27 y.o. diabetic black female here for follow-up of microhematuria and recurrent BV.    Today's 12/24/2022 Cystoscopy with no bladder lesions    10/20/2022 Urine Cytology: Negative  11/28/2022 CT Urogram:   Genitourinary: Multiple bilateral nonobstructing nephroliths measuring up to 4 mm on the right (axial image 46/102). Normal kidneys otherwise. Normal ureters and bladder. Normal uterus. No suspicious adnexal masses.   Above results reviewed w/ patient.     Noctura 2-3x/night but has sleeping issues.  Daytime urinary Frequency q 1-2 hr  No incontinence.  No slow stream or hesitancy.  No dysuria.  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      From 10/13/2022 visit with Raoul Pitch, PA:  Patient seen in consultation as referred by Dr. Hazeline Junker for urinary frequency. She is currently taking Bactrim DS 800-160 mg BID x 7 days for a recently diagnosed UTI.     Baseline urinary Symptoms  Nocturia: 3   Frequency: Yes, she can void about 3 x every 1 hour  Hesitancy: on occasion, she may have to even to push  Urgency: Yes, she had UUI close calls   SUI:No             Dysuria: No  Hematuria: Yes, it started recently with the UTI  FOS: Strong-slow    H/O kidney stones: Yes. Last episode it was years ago. .3    Denies anticoagulation  Denies GU trauma or surgery    Fhx: Denies bladder cancer or renal cancer    Social Hx:   Never smoker  Denies  English as a second language teacher exposure, pelvic radiation, or chemotherapy exposure    PMH/PSH/SocHx/FamHx/Meds & allergies reviewed.   Past Medical History:   Diagnosis Date    Diabetes mellitus ([place])     GERD (gastroesophageal reflux disease)      History reviewed. No pertinent surgical history.  Social History     Socioeconomic History    Marital status: Single     Spouse name: Not on file    Number of children: Not on file    Years of education: Not on file    Highest education level: Not on file   Occupational History    Not on file   Tobacco Use    Smoking status: Never    Smokeless tobacco: Never   Vaping Use    Vaping Use: Never used   Substance and Sexual Activity    Alcohol use: Not Currently    Drug use: Never    Sexual activity: Yes   Other Topics Concern    Not on file   Social History Narrative    Not on file     Social Determinants of Health     Financial Resource Strain: Not on file   Food Insecurity: Not  on file   Transportation Needs: Not on file   Physical Activity: Not on file   Stress: Not on file   Social Connections: Not on file   Intimate Partner Violence: Not on file   Housing Stability: Not on file     Family History   Problem Relation Age of Onset    Asthma Sister      Allergies   Allergen Reactions    Ibuprofen Hives    Pecan Nut (Diagnostic) Hives and Itching    Pecan Extract Allergy Skin Test Hives     Other reaction(s): Other (See Comments), unknown     Current Outpatient Medications on File Prior to Visit   Medication Sig Dispense Refill    amoxicillin (AMOXIL) 500 MG capsule TAKE 1 CAPSULE (500 MG) BY ORAL ROUTE EVERY 8 HOURS X 7 DAYS      sertraline (ZOLOFT) 25 MG tablet Take 1 tablet by mouth daily      sulfamethoxazole-trimethoprim (BACTRIM DS) 800-160 MG per tablet Take 1 tablet by mouth 2 times daily (Patient not taking: Reported on 12/24/2022)       No current facility-administered medications on file prior to visit.     Urinalysis:    Results for orders placed or performed in visit on  12/24/22   AMB POC URINALYSIS DIP STICK AUTO W/O MICRO   Result Value Ref Range    Color, Urine, POC Yellow     Clarity, Urine, POC Clear     Glucose, Urine, POC Negative     Bilirubin, Urine, POC Negative     Ketones, Urine, POC Negative     Specific Gravity, Urine, POC 1.030 1.001 - 1.035    Blood, Urine, POC Trace-lysed     pH, Urine, POC 6.0 4.6 - 8.0    Protein, Urine, POC Negative     Urobilinogen, POC 0.2 mg/dL     Nitrite, Urine, POC Negative     Leukocyte Esterase, Urine, POC Negative        Physical Exam:  Resp 16   Ht 1.6 m (5' 2.99")   Wt 94.8 kg (209 lb)   BMI 37.03 kg/m   Constitutional: Well developed, well-nourished -- in no acute distress.   Respiratory: No respiratory distress or difficulties  MSK: FROM  Skin: No jaundice      Neuro/Psych:  Patient with appropriate affect.  Alert and oriented x 3.      Mills Koller, MD    CC:  No primary care provider on file.

## 2023-03-17 ENCOUNTER — Encounter
